# Patient Record
Sex: Female | Born: 1985
Health system: Southern US, Community
[De-identification: ages and names within clinical notes are randomized; demographics above are authoritative.]

## PROBLEM LIST (undated history)

## (undated) DIAGNOSIS — G43909 Migraine, unspecified, not intractable, without status migrainosus: Secondary | ICD-10-CM

## (undated) DIAGNOSIS — J45909 Unspecified asthma, uncomplicated: Secondary | ICD-10-CM

## (undated) HISTORY — PX: BACK SURGERY: SHX140

---

## 2017-03-21 ENCOUNTER — Emergency Department (HOSPITAL_COMMUNITY)
Admission: EM | Admit: 2017-03-21 | Discharge: 2017-03-21 | Disposition: A | Discharge status: Home / Self Care | Payer: BLUE CROSS/BLUE SHIELD | Attending: Emergency Medicine | Admitting: Emergency Medicine

## 2017-03-21 ENCOUNTER — Encounter (HOSPITAL_COMMUNITY): Payer: Self-pay | Admitting: Emergency Medicine

## 2017-03-21 DIAGNOSIS — J45909 Unspecified asthma, uncomplicated: Secondary | ICD-10-CM | POA: Diagnosis present

## 2017-03-21 DIAGNOSIS — J4541 Moderate persistent asthma with (acute) exacerbation: Secondary | ICD-10-CM | POA: Diagnosis not present

## 2017-03-21 HISTORY — DX: Unspecified asthma, uncomplicated: J45.909

## 2017-03-21 HISTORY — DX: Migraine, unspecified, not intractable, without status migrainosus: G43.909

## 2017-03-21 MED ORDER — SODIUM CHLORIDE 0.9 % IV BOLUS (SEPSIS)
500.0000 mL | Freq: Once | INTRAVENOUS | Status: AC
  Administered 2017-03-21: 500 mL via INTRAVENOUS

## 2017-03-21 MED ORDER — ALBUTEROL SULFATE HFA 108 (90 BASE) MCG/ACT IN AERS: 2.0000 | INHALATION_SPRAY | RESPIRATORY_TRACT | 2 refills | Status: DC | PRN

## 2017-03-21 MED ORDER — PREDNISONE 10 MG PO TABS
50.0000 mg | ORAL_TABLET | Freq: Every day | ORAL | 0 refills | Status: DC
Start: 2017-03-21 — End: 2017-08-04

## 2017-03-21 NOTE — ED Notes (Signed)
Bed: [id number] Expected date:  Expected time:  Means of arrival:  Comments: EMS/Asthma/Epi

## 2017-03-21 NOTE — ED Notes (Signed)
ED Provider at bedside. 

## 2017-03-21 NOTE — ED Provider Notes (Signed)
[facility]-EMERGENCY DEPT Provider Note   CSN: [id number] Arrival date & time: 03/21/17  1529     History   Chief Complaint Chief Complaint  Patient presents with  . Asthma    HPI Jasmin Hayes is a 31 y.o. female.  The history is provided by the patient.  Shortness of Breath  This is a recurrent problem. The average episode lasts 1 hour. The problem occurs intermittently.The current episode started 1 to 2 hours ago. The problem has been resolved. Associated symptoms include rhinorrhea and wheezing. Pertinent negatives include no fever, no coryza, no sore throat, no cough, no sputum production, no hemoptysis, no PND, no orthopnea, no chest pain, no syncope, no vomiting, no abdominal pain, no leg pain and no leg swelling. The problem's precipitants include weather/humidity, smoke and pollens. Risk factors: no recent immobilization, OCPs, recent surgery, long distance traveling. She has tried beta-agonist inhalers for the symptoms. The treatment provided no relief. She has had prior hospitalizations. She has had prior ED visits. She has had prior ICU admissions. Associated medical issues include asthma. Associated medical issues do not include PE or DVT.   31 year old female with history of moderate persistant asthma who presents with dyspnea. Onset 1-2 hours ago while walking her dog. Feels like her typical asthma exacerbation, and tried inhaler at home with no good affect. Called EMS and given IM epi, solumedrol, magnesium sulfate, 15 mg albuterol and 1 mg atrovent. On arrival to ED feels back to baseline.   Past Medical History:  Diagnosis Date  . Asthma   . Migraines     There are no active problems to display for this patient.   Past Surgical History:  Procedure Laterality Date  . BACK SURGERY      OB History    No data available       Home Medications    Prior to Admission medications   Medication Sig Start Date End Date Taking? Authorizing Provider  cetirizine  (ZYRTEC) 10 MG tablet Take 10 mg by mouth daily.   Yes Historical Provider, MD  cyclobenzaprine (FLEXERIL) 5 MG tablet Take 5 mg by mouth 3 (three) times daily as needed for muscle spasms.   Yes Historical Provider, MD  docusate sodium (COLACE) 100 MG capsule Take 100 mg by mouth daily.   Yes Historical Provider, MD  escitalopram (LEXAPRO) 10 MG tablet Take 10 mg by mouth at bedtime.   Yes Historical Provider, MD  fluticasone (FLONASE) 50 MCG/ACT nasal spray Place 2 sprays into both nostrils daily.   Yes Historical Provider, MD  Fluticasone-Salmeterol (ADVAIR) 250-50 MCG/DOSE AEPB Inhale 1 puff into the lungs 2 (two) times daily.   Yes Historical Provider, MD  Magnesium 250 MG TABS Take 1 tablet by mouth daily.   Yes Historical Provider, MD  omeprazole (PRILOSEC) 20 MG capsule Take 20 mg by mouth daily.   Yes Historical Provider, MD  topiramate (TOPAMAX) 100 MG tablet TK 1 T PO BID 02/17/17  Yes Historical Provider, MD  albuterol (PROVENTIL HFA;VENTOLIN HFA) 108 (90 Base) MCG/ACT inhaler Inhale 2 puffs into the lungs every 4 (four) hours as needed for wheezing or shortness of breath. 03/21/17   Lavera Guiseana Duo Benicio Manna, MD  predniSONE (DELTASONE) 10 MG tablet Take 5 tablets (50 mg total) by mouth daily. 03/21/17   Lavera Guiseana Duo Charlese Gruetzmacher, MD    Family History No family history on file.  Social History Social History  Substance Use Topics  . Smoking status: Not on file  . Smokeless tobacco:  Not on file  . Alcohol use Not on file     Allergies   Penicillins   Review of Systems Review of Systems  Constitutional: Negative for fever.  HENT: Positive for rhinorrhea. Negative for sore throat.   Respiratory: Positive for shortness of breath and wheezing. Negative for cough, hemoptysis and sputum production.   Cardiovascular: Negative for chest pain, orthopnea, leg swelling, syncope and PND.  Gastrointestinal: Negative for abdominal pain and vomiting.  Genitourinary: Negative for difficulty urinating.    Allergic/Immunologic: Negative for immunocompromised state.  Neurological: Negative for syncope.  Hematological: Does not bruise/bleed easily.  Psychiatric/Behavioral: Negative for confusion.  All other systems reviewed and are negative.    Physical Exam Updated Vital Signs BP (!) 109/57   Pulse 87   Temp 98.9 F (37.2 C) (Oral)   Resp 15   SpO2 98%   Physical Exam Physical Exam  Nursing note and vitals reviewed. Constitutional: Well developed, well nourished, non-toxic, and in no acute distress Head: Normocephalic and atraumatic.  Mouth/Throat: Oropharynx is clear and moist.  Neck: Normal range of motion. Neck supple.  Cardiovascular: Tachcyardic rate and regular rhythm.   Pulmonary/Chest: Effort normal and breath sounds normal.  Abdominal: Soft. There is no tenderness. There is no rebound and no guarding.  Musculoskeletal: Normal range of motion. no calf tenderness. No LE edema Neurological: Alert, no facial droop, fluent speech, moves all extremities symmetrically Skin: Skin is warm and dry.  Psychiatric: Cooperative   ED Treatments / Results  Labs (all labs ordered are listed, but only abnormal results are displayed) Labs Reviewed  PREGNANCY, URINE    EKG  EKG Interpretation None       Radiology No results found.  Procedures Procedures (including critical care time)  Medications Ordered in ED Medications  sodium chloride 0.9 % bolus 500 mL (0 mLs Intravenous Stopped 03/21/17 1840)     Initial Impression / Assessment and Plan / ED Course  I have reviewed the triage vital signs and the nursing notes.  Pertinent labs & imaging results that were available during my care of the patient were reviewed by me and considered in my medical decision making (see chart for details).     Presenting with typical asthma exacerbation, which is resolved now after extensive treatment by EMS. Feels back to baseline. States environmental triggers have been her biggest  trigger to asthma exacerbations recently and she sees a pulmonologist from [place].   She is observed in the ED for 3 hours, and continues to feel well with normal vital signs and normal work of breathing. Lungs remain clear. She is able to continue treatments at home based every 3-4 hours as needed. We'll also discharge home with steroids.  Strict return and follow-up instructions reviewed. She expressed understanding of all discharge instructions and felt comfortable with the plan of care.   Final Clinical Impressions(s) / ED Diagnoses   Final diagnoses:  Moderate persistent asthma with acute exacerbation    New Prescriptions New Prescriptions   ALBUTEROL (PROVENTIL HFA;VENTOLIN HFA) 108 (90 BASE) MCG/ACT INHALER    Inhale 2 puffs into the lungs every 4 (four) hours as needed for wheezing or shortness of breath.   PREDNISONE (DELTASONE) 10 MG TABLET    Take 5 tablets (50 mg total) by mouth daily.     Lavera Guise, MD 03/21/17 [phone]

## 2017-03-21 NOTE — ED Triage Notes (Signed)
Per EMS, patient from home, c/o asthma attack after walking dog today. Audible wheezing upon arrival. Breathing treatment (15 mg Albuterol, 1mg  Atrovent), 0.3 epi, 125 mg Solumedrol, 2g Mag Sulfate with EMS. 20 g L Hand.

## 2017-03-21 NOTE — Discharge Instructions (Signed)
Please take steroids as prescribed. He can continue to use your inhaler or nebulizer every 3-4 hours for shortness of breath. Return without fail for worsening symptoms, including difficulty breathing, passing out, confusion, or any other symptoms concerning to you

## 2017-03-26 ENCOUNTER — Encounter: Payer: Self-pay | Admitting: Neurology

## 2017-03-26 ENCOUNTER — Ambulatory Visit (INDEPENDENT_AMBULATORY_CARE_PROVIDER_SITE_OTHER): Payer: BLUE CROSS/BLUE SHIELD | Admitting: Neurology

## 2017-03-26 VITALS — BP 106/62 | HR 76 | Ht 67.0 in | Wt 203.0 lb

## 2017-03-26 DIAGNOSIS — G43809 Other migraine, not intractable, without status migrainosus: Secondary | ICD-10-CM

## 2017-03-26 MED ORDER — ZOLMITRIPTAN 5 MG NA SOLN: 1.0000 | NASAL | 0 refills | Status: DC | PRN

## 2017-03-26 MED ORDER — TOPIRAMATE 100 MG PO TABS: 250.0000 mg | ORAL_TABLET | Freq: Every day | ORAL | 3 refills | Status: DC

## 2017-03-26 MED ORDER — ZOLMITRIPTAN 5 MG NA SOLN: NASAL | 3 refills | Status: DC

## 2017-03-26 NOTE — Progress Notes (Signed)
NEUROLOGY CONSULTATION NOTE  Jasmin Hayes MRN: [id number] DOB: 08/19/1986  Referring provider: Dr. Wynelle Link Primary care provider: Dr. Wynelle Link  Reason for consult:  migraines  HISTORY OF PRESENT ILLNESS: Jasmin Hayes is a 31 year old female who presents for migraines.  She is accompanied by her fiancee who supplements history.  Onset:  Since mid 20s following back surgery.  They were well controlled when she was living in [place], but have increased since moving down to [place].   Location:  Bilateral/unilateral occipital radiating up Quality:  stabbing Intensity:  10/10 Aura:  Flashing lights and scotomas.  Once it was followed by vision loss Prodrome:  no Postdrome:  no Associated symptoms:  Nausea, vomiting, photophobia, phonophobia.  She has not had any new worse headache of her life, waking up from sleep Duration:  10-15 minutes with Cambia.  Otherwise, all day Also has daily pressure headache and in face and around eyes. Frequency:  15 headache days per month (1 to 2 days migraines) Frequency of abortive medication: almost daily Triggers/exacerbating factors:  Multiple triggers since moving down here:  Change in weather with increased allergies, now taking lower dose of topiramate (  instead of  daily), irregular menses (PCOS), stress. Relieving factors:  Cambia (but very expensive) Activity:  aggravates  Past NSAIDS:  Cambia (effective but expensive), ibuprofen Past analgesics:  Tylenol, Goodys Past abortive triptans:  Sumatriptan tablet Past muscle relaxants:  no Past anti-emetic:  no Past antihypertensive medications:  no Past antidepressant medications:  Nortriptyline, amitriptyline Past anticonvulsant medications:  no Past vitamins/Herbal/Supplements:  no Other past therapies:  Trigger point injections in neck and shoulders  Current NSAIDS:  naproxen Current analgesics:  Excedrin Migraine Current triptans:  no Current anti-emetic:  no Current  muscle relaxants:  Flexeril (for back) Current anti-anxiolytic:  no Current sleep aide:  no Current Antihypertensive medications:  no Current Antidepressant medications:  Lexapro Current Anticonvulsant medications:  topiramate  Current Vitamins/Herbal/Supplements:  Mg  Current Antihistamines/Decongestants:  Zyrtec Other therapy:  no  Caffeine:  no Alcohol:  no Smoker:  no Diet:  Drinks water Exercise:  walks Depression/anxiety:  anxiety Sleep hygiene:  varies Family history of headache:  mom  PAST MEDICAL HISTORY: Past Medical History:  Diagnosis Date  . Asthma   . Migraines     PAST SURGICAL HISTORY: Past Surgical History:  Procedure Laterality Date  . BACK SURGERY      MEDICATIONS: Current Outpatient Prescriptions on File Prior to Visit  Medication Sig Dispense Refill  . albuterol (PROVENTIL HFA;VENTOLIN HFA) 108 (90 Base) MCG/ACT inhaler Inhale 2 puffs into the lungs every 4 (four) hours as needed for wheezing or shortness of breath. 1 Inhaler 2  . cetirizine (ZYRTEC) 10 MG tablet Take 10 mg by mouth daily.    . cyclobenzaprine (FLEXERIL) 5 MG tablet Take 5 mg by mouth 3 (three) times daily as needed for muscle spasms.    Marland Kitchen docusate sodium (COLACE) 100 MG capsule Take 100 mg by mouth daily.    Marland Kitchen escitalopram (LEXAPRO) 10 MG tablet Take 10 mg by mouth at bedtime.    . fluticasone (FLONASE) 50 MCG/ACT nasal spray Place 2 sprays into both nostrils daily.    . Fluticasone-Salmeterol (ADVAIR) 250-50 MCG/DOSE AEPB Inhale 1 puff into the lungs 2 (two) times daily.    . Magnesium 250 MG TABS Take 1 tablet by mouth daily.    Marland Kitchen omeprazole (PRILOSEC) 20 MG capsule Take 20 mg by mouth daily.    . predniSONE (DELTASONE)  10 MG tablet Take 5 tablets (50 mg total) by mouth daily. 20 tablet 0   No current facility-administered medications on file prior to visit.     ALLERGIES: Allergies  Allergen Reactions  . Penicillins     Has patient had a PCN reaction causing  immediate rash, facial/tongue/throat swelling, SOB or lightheadedness with hypotension: Yes Has patient had a PCN reaction causing severe rash involving mucus membranes or skin necrosis: No Has patient had a PCN reaction that required hospitalization Yes Has patient had a PCN reaction occurring within the last 10 years: No If all of the above answers are "NO", then may proceed with Cephalosporin use.     FAMILY HISTORY: No family history on file.  SOCIAL HISTORY: Social History   Social History  . Marital status: Significant Other    Spouse name: N/A  . Number of children: N/A  . Years of education: N/A   Occupational History  . Not on file.   Social History Main Topics  . Smoking status: Not on file  . Smokeless tobacco: Not on file  . Alcohol use Not on file  . Drug use: Unknown  . Sexual activity: Not on file   Other Topics Concern  . Not on file   Social History Narrative  . No narrative on file    REVIEW OF SYSTEMS: Constitutional: No fevers, chills, or sweats, no generalized fatigue, change in appetite Eyes: No visual changes, double vision, eye pain Ear, nose and throat: No hearing loss, ear pain, nasal congestion, sore throat Cardiovascular: No chest pain, palpitations Respiratory:  No shortness of breath at rest or with exertion, wheezes GastrointestinaI: No nausea, vomiting, diarrhea, abdominal pain, fecal incontinence Genitourinary:  No dysuria, urinary retention or frequency Musculoskeletal:  No neck pain, back pain Integumentary: No rash, pruritus, skin lesions Neurological: as above Psychiatric: No depression, insomnia, anxiety Endocrine: No palpitations, fatigue, diaphoresis, mood swings, change in appetite, change in weight, increased thirst Hematologic/Lymphatic:  No purpura, petechiae. Allergic/Immunologic: no itchy/runny eyes, nasal congestion, recent allergic reactions, rashes  PHYSICAL EXAM: Vitals:   03/26/17 1456  BP: 106/62  Pulse: 76     General: No acute distress.  Patient appears well-groomed.  Head:  Normocephalic/atraumatic Eyes:  fundi examined but not visualized Neck: supple, no paraspinal tenderness, full range of motion Back: No paraspinal tenderness Heart: regular rate and rhythm Lungs: Clear to auscultation bilaterally. Vascular: No carotid bruits. Neurological Exam: Mental status: alert and oriented to person, place, and time, recent and remote memory intact, fund of knowledge intact, attention and concentration intact, speech fluent and not dysarthric, language intact. Cranial nerves: CN I: not tested CN II: pupils equal, round and reactive to light, visual fields intact CN III, IV, VI:  full range of motion, no nystagmus, no ptosis CN V: facial sensation intact CN VII: upper and lower face symmetric CN VIII: hearing intact CN IX, X: gag intact, uvula midline CN XI: sternocleidomastoid and trapezius muscles intact CN XII: tongue midline Bulk & Tone: normal, no fasciculations. Motor:  5/5 throughout  Sensation: temperature and vibration sensation intact. Deep Tendon Reflexes:  2+ throughout, toes downgoing.  Finger to nose testing:  Without dysmetria.  Heel to shin:  Without dysmetria.  Gait:  Normal station and stride.  Able to turn and tandem walk. Romberg negative.  IMPRESSION: Cervicogenic migraines, complicated by medication overuse and seasonal allergies.  PLAN: 1.  Increase topiramate to  at bedtime.  Call in 4 weeks with update and we can adjust dose  if needed.  Take folic acid  daily.  Advised not to get pregnant while on this medication. 2.  Take Zomig , 1 spray in one nostril at earliest onset of headache.  May repeat dose once in 2 hours if needed.  Do not exceed two sprays in 24 hours. 3.  Stop naproxen and Tylenol.  Limit use of pain relievers to no more than 2 days out of the week.  These medications include acetaminophen, ibuprofen, triptans and narcotics.  This will help  reduce risk of rebound headaches. 4.  Be aware of common food triggers such as processed sweets, processed foods with nitrites (such as deli meat, hot dogs, sausages), foods with MSG, alcohol (such as wine), chocolate, certain cheeses, certain fruits (dried fruits, some citrus fruit), vinegar, diet soda. 4.  Avoid caffeine 5.  Routine exercise 6.  Proper sleep hygiene 7.  Stay adequately hydrated with water 8.  Keep a headache diary. 9.  Maintain proper stress management. 10.  Do not skip meals. 11.  Consider supplements:  Magnesium citrate  to  daily, riboflavin , Coenzyme Q 10  three times daily 12.  Refer to Dr. Antoine Primas of Sports Medicine for neck pain 13.  Follow up in 3 months.  Thank you for allowing me to take part in the care of this patient.  Shon Millet, DO  CC:  Deatra James, MD

## 2017-03-26 NOTE — Patient Instructions (Addendum)
Migraine Recommendations: 1.  Increase topiramate to  at bedtime.  Call in 4 weeks with update and we can adjust dose if needed. 2.  Take Zomig , 1 spray in one nostril at earliest onset of headache.  May repeat dose once in 2 hours if needed.  Do not exceed two sprays in 24 hours. 3.  Stop naproxen and Tylenol.  Limit use of pain relievers to no more than 2 days out of the week.  These medications include acetaminophen, ibuprofen, triptans and narcotics.  This will help reduce risk of rebound headaches. 4.  Be aware of common food triggers such as processed sweets, processed foods with nitrites (such as deli meat, hot dogs, sausages), foods with MSG, alcohol (such as wine), chocolate, certain cheeses, certain fruits (dried fruits, some citrus fruit), vinegar, diet soda. 4.  Avoid caffeine 5.  Routine exercise 6.  Proper sleep hygiene 7.  Stay adequately hydrated with water 8.  Keep a headache diary. 9.  Maintain proper stress management. 10.  Do not skip meals. 11.  Consider supplements:  Magnesium citrate  to  daily, riboflavin , Coenzyme Q 10  three times daily 12.  Refer to Dr. Antoine Primas of Sports Medicine for neck pain 13.  Follow up in 3 months.

## 2017-05-24 ENCOUNTER — Telehealth: Payer: Self-pay | Admitting: Neurology

## 2017-05-24 MED ORDER — TOPIRAMATE 100 MG PO TABS: 250.0000 mg | ORAL_TABLET | Freq: Every day | ORAL | 2 refills | Status: DC

## 2017-05-24 NOTE — Telephone Encounter (Signed)
Prescription for topiramate  [facility] [address] [facility] of [facility]

## 2017-05-24 NOTE — Telephone Encounter (Signed)
RX sent

## 2017-06-25 ENCOUNTER — Ambulatory Visit: Payer: BLUE CROSS/BLUE SHIELD | Admitting: Neurology

## 2017-06-25 DIAGNOSIS — Z029 Encounter for administrative examinations, unspecified: Secondary | ICD-10-CM

## 2017-07-10 ENCOUNTER — Encounter: Payer: Self-pay | Admitting: Neurology

## 2017-08-04 ENCOUNTER — Emergency Department (HOSPITAL_COMMUNITY): Payer: Self-pay

## 2017-08-04 ENCOUNTER — Encounter (HOSPITAL_COMMUNITY): Payer: Self-pay | Admitting: *Deleted

## 2017-08-04 ENCOUNTER — Emergency Department (HOSPITAL_COMMUNITY)
Admission: EM | Admit: 2017-08-04 | Discharge: 2017-08-04 | Disposition: A | Discharge status: Home / Self Care | Payer: Self-pay | Attending: Emergency Medicine | Admitting: Emergency Medicine

## 2017-08-04 DIAGNOSIS — Y939 Activity, unspecified: Secondary | ICD-10-CM | POA: Insufficient documentation

## 2017-08-04 DIAGNOSIS — S060X1A Concussion with loss of consciousness of 30 minutes or less, initial encounter: Secondary | ICD-10-CM | POA: Insufficient documentation

## 2017-08-04 DIAGNOSIS — W1830XA Fall on same level, unspecified, initial encounter: Secondary | ICD-10-CM | POA: Insufficient documentation

## 2017-08-04 DIAGNOSIS — M5430 Sciatica, unspecified side: Secondary | ICD-10-CM

## 2017-08-04 DIAGNOSIS — Y999 Unspecified external cause status: Secondary | ICD-10-CM | POA: Insufficient documentation

## 2017-08-04 DIAGNOSIS — J45909 Unspecified asthma, uncomplicated: Secondary | ICD-10-CM | POA: Insufficient documentation

## 2017-08-04 DIAGNOSIS — Y92000 Kitchen of unspecified non-institutional (private) residence as  the place of occurrence of the external cause: Secondary | ICD-10-CM | POA: Insufficient documentation

## 2017-08-04 DIAGNOSIS — Z79899 Other long term (current) drug therapy: Secondary | ICD-10-CM | POA: Insufficient documentation

## 2017-08-04 DIAGNOSIS — R55 Syncope and collapse: Secondary | ICD-10-CM | POA: Insufficient documentation

## 2017-08-04 DIAGNOSIS — M5431 Sciatica, right side: Secondary | ICD-10-CM | POA: Insufficient documentation

## 2017-08-04 LAB — BASIC METABOLIC PANEL
ANION GAP: 8 (ref 5–15)
BUN: 10 mg/dL (ref 6–20)
CHLORIDE: 108 mmol/L (ref 101–111)
CO2: 24 mmol/L (ref 22–32)
Calcium: 9.4 mg/dL (ref 8.9–10.3)
Creatinine, Ser: 0.61 mg/dL (ref 0.44–1.00)
GFR calc Af Amer: >60 mL/min (ref >60)
GFR calc non Af Amer: >60 mL/min (ref >60)
GLUCOSE: 92 mg/dL (ref 65–99)
POTASSIUM: 3.4 mmol/L — AB (ref 3.5–5.1)
Sodium: 140 mmol/L (ref 135–145)

## 2017-08-04 LAB — CBC WITH DIFFERENTIAL/PLATELET
Basophils Absolute: 0 10*3/uL (ref 0.0–0.1)
Basophils Relative: 0 %
Eosinophils Absolute: 0.1 10*3/uL (ref 0.0–0.7)
Eosinophils Relative: 1 %
HEMATOCRIT: 38.9 % (ref 36.0–46.0)
Hemoglobin: 13.3 g/dL (ref 12.0–15.0)
LYMPHS ABS: 3.1 10*3/uL (ref 0.7–4.0)
LYMPHS PCT: 34 %
MCH: 30.3 pg (ref 26.0–34.0)
MCHC: 34.2 g/dL (ref 30.0–36.0)
MCV: 88.6 fL (ref 78.0–100.0)
MONO ABS: 0.5 10*3/uL (ref 0.1–1.0)
MONOS PCT: 5 %
NEUTROS ABS: 5.5 10*3/uL (ref 1.7–7.7)
Neutrophils Relative %: 60 %
Platelets: 277 10*3/uL (ref 150–400)
RBC: 4.39 MIL/uL (ref 3.87–5.11)
RDW: 12.5 % (ref 11.5–15.5)
WBC: 9.3 10*3/uL (ref 4.0–10.5)

## 2017-08-04 LAB — I-STAT BETA HCG BLOOD, ED (MC, WL, AP ONLY): I-stat hCG, quantitative: <5 m[IU]/mL (ref <5)

## 2017-08-04 LAB — CBG MONITORING, ED: GLUCOSE-CAPILLARY: 89 mg/dL (ref 65–99)

## 2017-08-04 LAB — TROPONIN I: Troponin I: <0.03 ng/mL (ref <0.03)

## 2017-08-04 MED ORDER — LORAZEPAM 2 MG/ML IJ SOLN
1.0000 mg | Freq: Once | INTRAMUSCULAR | Status: AC
  Administered 2017-08-04: 1 mg via INTRAVENOUS
  Filled 2017-08-04: qty 1

## 2017-08-04 MED ORDER — DEXAMETHASONE SODIUM PHOSPHATE 10 MG/ML IJ SOLN
10.0000 mg | Freq: Once | INTRAMUSCULAR | Status: AC
  Administered 2017-08-04: 10 mg via INTRAVENOUS
  Filled 2017-08-04: qty 1

## 2017-08-04 MED ORDER — ONDANSETRON HCL 4 MG/2ML IJ SOLN
4.0000 mg | Freq: Once | INTRAMUSCULAR | Status: AC
  Administered 2017-08-04: 4 mg via INTRAVENOUS
  Filled 2017-08-04: qty 2

## 2017-08-04 MED ORDER — KETOROLAC TROMETHAMINE 10 MG PO TABS: 10.0000 mg | ORAL_TABLET | Freq: Four times a day (QID) | ORAL | 0 refills | Status: AC | PRN

## 2017-08-04 MED ORDER — SODIUM CHLORIDE 0.9 % IV SOLN
INTRAVENOUS | Status: DC
  Administered 2017-08-04: 20:00:00 via INTRAVENOUS

## 2017-08-04 MED ORDER — PREDNISONE 10 MG (21) PO TBPK: ORAL_TABLET | ORAL | 0 refills | Status: DC

## 2017-08-04 MED ORDER — SODIUM CHLORIDE 0.9 % IV BOLUS (SEPSIS)
1000.0000 mL | Freq: Once | INTRAVENOUS | Status: AC
  Administered 2017-08-04: 1000 mL via INTRAVENOUS

## 2017-08-04 MED ORDER — MORPHINE SULFATE (PF) 2 MG/ML IV SOLN
4.0000 mg | Freq: Once | INTRAVENOUS | Status: AC
  Administered 2017-08-04: 4 mg via INTRAVENOUS
  Filled 2017-08-04: qty 2

## 2017-08-04 MED ORDER — HYDROMORPHONE HCL 1 MG/ML IJ SOLN
1.0000 mg | Freq: Once | INTRAMUSCULAR | Status: AC
  Administered 2017-08-04: 1 mg via INTRAVENOUS
  Filled 2017-08-04: qty 1

## 2017-08-04 NOTE — ED Triage Notes (Signed)
EMS reports pts sciatica flare in her rt hip caused her to fall and hit head on ? [employer]. Pt has history of sciatica. LOC noted, pt anxious in beginning then calm. A&O x 4 now. #18 L f/a

## 2017-08-04 NOTE — ED Provider Notes (Signed)
[facility]-EMERGENCY DEPT Provider Note   CSN: [id number] Arrival date & time: 08/04/17  [id number]     History   Chief Complaint Chief Complaint  Patient presents with  . Fall  . Hit Head    HPI Jasmin Hayes is a 31 y.o. female.  Pt presents to the ED today with a syncope episode.  She thinks her right leg gave out and she hit her head on the dishwasher, but she is not sure.  Her right sided sciatica has been bothering her for the past few days.  She does have an appt with her pcp for that in the next few days.  Her right leg did go out earlier today.  She said that she last remembered going to the kitchen and woke up on the floor.  She c/o headache.      Past Medical History:  Diagnosis Date  . Asthma   . Migraines     There are no active problems to display for this patient.   Past Surgical History:  Procedure Laterality Date  . BACK SURGERY      OB History    No data available       Home Medications    Prior to Admission medications   Medication Sig Start Date End Date Taking? Authorizing Provider  albuterol (PROVENTIL HFA;VENTOLIN HFA) 108 (90 Base) MCG/ACT inhaler Inhale 2 puffs into the lungs every 4 (four) hours as needed for wheezing or shortness of breath. 03/21/17   Lavera GuiseLiu, Dana Duo, MD  cetirizine (ZYRTEC) 10 MG tablet Take 10 mg by mouth daily.    [provider]  cyclobenzaprine (FLEXERIL) 5 MG tablet Take 5 mg by mouth 3 (three) times daily as needed for muscle spasms.    [provider]  docusate sodium (COLACE) 100 MG capsule Take 100 mg by mouth daily.    [provider]  escitalopram (LEXAPRO) 10 MG tablet Take 10 mg by mouth at bedtime.    [provider]  fluticasone (FLONASE) 50 MCG/ACT nasal spray Place 2 sprays into both nostrils daily.    [provider]  Fluticasone-Salmeterol (ADVAIR) 250-50 MCG/DOSE AEPB Inhale 1 puff into the lungs 2 (two) times daily.    [provider]  ketorolac (TORADOL)  10 MG tablet Take 1 tablet (10 mg total) by mouth every 6 (six) hours as needed. 08/04/17   Jacalyn LefevreHaviland, Tris Howell, MD  Magnesium 250 MG TABS Take 1 tablet by mouth daily.    [provider]  omeprazole (PRILOSEC) 20 MG capsule Take 20 mg by mouth daily.    [provider]  predniSONE (STERAPRED UNI-PAK 21 TAB) 10 MG (21) TBPK tablet Take 6 tabs by mouth daily  for 2 days, then 5 tabs for 2 days, then 4 tabs for 2 days, then 3 tabs for 2 days, 2 tabs for 2 days, then 1 tab by mouth daily for 2 days 08/04/17   Jacalyn LefevreHaviland, Garnette Greb, MD  topiramate (TOPAMAX) 100 MG tablet Take 2.5 tablets (250 mg total) by mouth at bedtime. 05/24/17   Drema DallasJaffe, Adam R, DO  zolmitriptan (ZOMIG) 5 MG nasal solution 1 spray in nose at earliest onset of migraine.  May repeat once in 2 hours if needed. 03/26/17   Drema DallasJaffe, Adam R, DO  zolmitriptan (ZOMIG) 5 MG nasal solution Place 1 spray into the nose as needed for migraine. 03/26/17   Drema DallasJaffe, Adam R, DO    Family History No family history on file.  Social History Social History  Substance  Use Topics  . Smoking status: Never Smoker  . Smokeless tobacco: Never Used  . Alcohol use Not on file     Allergies   Penicillins   Review of Systems Review of Systems  Neurological: Positive for syncope and headaches.  All other systems reviewed and are negative.    Physical Exam Updated Vital Signs BP (!) 115/99 (BP Location: Right Arm)   Pulse 79   Temp (!) 97.2 F (36.2 C) (Oral)   Resp 17   LMP 07/05/2017   SpO2 95%   Physical Exam  Constitutional: She is oriented to person, place, and time. She appears well-developed. She appears distressed.  HENT:  Head: Normocephalic and atraumatic.  Right Ear: External ear normal.  Left Ear: External ear normal.  Nose: Nose normal.  Mouth/Throat: Oropharynx is clear and moist.  Eyes: Pupils are equal, round, and reactive to light. Conjunctivae and EOM are normal.  Neck: Normal range of motion. Neck supple.    Cardiovascular: Normal rate, regular rhythm, normal heart sounds and intact distal pulses.   Pulmonary/Chest: Effort normal and breath sounds normal.  Abdominal: Soft. Bowel sounds are normal.  Musculoskeletal: Normal range of motion.  Neurological: She is alert and oriented to person, place, and time.  + straight leg raise  Skin: Skin is warm.  Psychiatric: She has a normal mood and affect. Her behavior is normal. Judgment and thought content normal.  Nursing note and vitals reviewed.    ED Treatments / Results  Labs (all labs ordered are listed, but only abnormal results are displayed) Labs Reviewed  BASIC METABOLIC PANEL - Abnormal; Notable for the following:       Result Value   Potassium 3.4 (*)    All other components within normal limits  CBC WITH DIFFERENTIAL/PLATELET  TROPONIN I  URINALYSIS, ROUTINE W REFLEX MICROSCOPIC  CBG MONITORING, ED  I-STAT BETA HCG BLOOD, ED ([facility], [facility], [facility] ONLY)  POC URINE PREG, ED    EKG  EKG Interpretation  Date/Time:  Saturday August 04 2017 19:37:55 EDT Ventricular Rate:  67 PR Interval:    QRS Duration: 90 QT Interval:  385 QTC Calculation: 407 R Axis:   13 Text Interpretation:  Sinus rhythm Confirmed by Jacalyn Lefevre [phone]) on 08/04/2017 8:11:14 PM       Radiology Dg Chest 2 View  Result Date: 08/04/2017 CLINICAL DATA:  Fall EXAM: CHEST  2 VIEW COMPARISON:  None. FINDINGS: Normal heart size. Normal mediastinal contour. No pneumothorax. No pleural effusion. Lungs appear clear, with no acute consolidative airspace disease and no pulmonary edema. IMPRESSION: No active cardiopulmonary disease. Electronically Signed   By: Delbert Phenix M.D.   On: 08/04/2017 20:52   Dg Lumbar Spine Complete  Result Date: 08/04/2017 CLINICAL DATA:  31 year old female with history of trauma from a fall at home complaining of back pain. EXAM: LUMBAR SPINE - COMPLETE 4+ VIEW COMPARISON:  No priors. FINDINGS: Five views of the lumbar spine demonstrate  postoperative changes of PLIF at L4-S1, with interbody grafts at L4-L5 and L5-S1. Alignment is anatomic. No evidence of acute hardware abnormality. No acute displaced fractures or compression type fractures in the lumbar spine. IMPRESSION: 1. No acute radiographic abnormality of the lumbar spine. Postoperative changes, as above. Electronically Signed   By: Trudie Reed M.D.   On: 08/04/2017 20:58   Ct Head Wo Contrast  Result Date: 08/04/2017 CLINICAL DATA:  31 year old female with history of trauma from a fall with injury to the head. Loss of consciousness. EXAM:  CT HEAD WITHOUT CONTRAST TECHNIQUE: Contiguous axial images were obtained from the base of the skull through the vertex without intravenous contrast. COMPARISON:  None. FINDINGS: Brain: No evidence of acute infarction, hemorrhage, hydrocephalus, extra-axial collection or mass lesion/mass effect. Vascular: No hyperdense vessel or unexpected calcification. Skull: Normal. Negative for fracture or focal lesion. Sinuses/Orbits: No acute finding. Other: None. IMPRESSION: 1. No evidence of significant acute traumatic injury to the skull or brain. 2. The appearance of the brain is normal. Electronically Signed   By: Trudie Reed M.D.   On: 08/04/2017 21:14    Procedures Procedures (including critical care time)  Medications Ordered in ED Medications  sodium chloride 0.9 % bolus 1,000 mL (0 mLs Intravenous Stopped 08/04/17 2059)    And  0.9 %  sodium chloride infusion ( Intravenous New Bag/Given 08/04/17 1954)  LORazepam (ATIVAN) injection 1 mg (not administered)  morphine 2 MG/ML injection 4 mg (4 mg Intravenous Given 08/04/17 1949)  ondansetron (ZOFRAN) injection 4 mg (4 mg Intravenous Given 08/04/17 1950)  dexamethasone (DECADRON) injection 10 mg (10 mg Intravenous Given 08/04/17 1949)  HYDROmorphone (DILAUDID) injection 1 mg (1 mg Intravenous Given 08/04/17 2059)     Initial Impression / Assessment and Plan / ED Course  I have reviewed  the triage vital signs and the nursing notes.  Pertinent labs & imaging results that were available during my care of the patient were reviewed by me and considered in my medical decision making (see chart for details).    Pt is feeling better.  She is encouraged to f/u with pcp and to return if worse.  Final Clinical Impressions(s) / ED Diagnoses   Final diagnoses:  Concussion with loss of consciousness of 30 minutes or less, initial encounter  Acute sciatica    New Prescriptions New Prescriptions   KETOROLAC (TORADOL) 10 MG TABLET    Take 1 tablet (10 mg total) by mouth every 6 (six) hours as needed.   PREDNISONE (STERAPRED UNI-PAK 21 TAB) 10 MG (21) TBPK TABLET    Take 6 tabs by mouth daily  for 2 days, then 5 tabs for 2 days, then 4 tabs for 2 days, then 3 tabs for 2 days, 2 tabs for 2 days, then 1 tab by mouth daily for 2 days     Jacalyn Lefevre, MD 08/04/17 2133

## 2017-09-14 ENCOUNTER — Encounter: Payer: Self-pay | Admitting: Family Medicine

## 2017-09-30 ENCOUNTER — Emergency Department (HOSPITAL_COMMUNITY): Payer: BLUE CROSS/BLUE SHIELD

## 2017-09-30 ENCOUNTER — Encounter (HOSPITAL_COMMUNITY): Payer: Self-pay | Admitting: Emergency Medicine

## 2017-09-30 ENCOUNTER — Emergency Department (HOSPITAL_COMMUNITY)
Admission: EM | Admit: 2017-09-30 | Discharge: 2017-09-30 | Disposition: A | Discharge status: Home / Self Care | Payer: BLUE CROSS/BLUE SHIELD | Attending: Emergency Medicine | Admitting: Emergency Medicine

## 2017-09-30 DIAGNOSIS — J45909 Unspecified asthma, uncomplicated: Secondary | ICD-10-CM | POA: Insufficient documentation

## 2017-09-30 DIAGNOSIS — Z79899 Other long term (current) drug therapy: Secondary | ICD-10-CM | POA: Insufficient documentation

## 2017-09-30 DIAGNOSIS — J069 Acute upper respiratory infection, unspecified: Secondary | ICD-10-CM

## 2017-09-30 DIAGNOSIS — N390 Urinary tract infection, site not specified: Secondary | ICD-10-CM | POA: Insufficient documentation

## 2017-09-30 MED ORDER — BENZONATATE 100 MG PO CAPS: 100.0000 mg | ORAL_CAPSULE | Freq: Three times a day (TID) | ORAL | 0 refills | Status: DC

## 2017-09-30 MED ORDER — IPRATROPIUM-ALBUTEROL 0.5-2.5 (3) MG/3ML IN SOLN
3.0000 mL | Freq: Once | RESPIRATORY_TRACT | Status: AC
  Administered 2017-09-30: 3 mL via RESPIRATORY_TRACT
  Filled 2017-09-30: qty 3

## 2017-09-30 MED ORDER — DEXAMETHASONE SODIUM PHOSPHATE 10 MG/ML IJ SOLN
10.0000 mg | Freq: Once | INTRAMUSCULAR | Status: AC
  Administered 2017-09-30: 10 mg via INTRAMUSCULAR
  Filled 2017-09-30: qty 1

## 2017-09-30 NOTE — ED Triage Notes (Addendum)
Pt reports productive cough, generalized body aches, and L side ear ache for the past 3 days. Hx of asthma and reports her symptoms are making her asthma exacerbate. No wheezing noted

## 2017-09-30 NOTE — Discharge Instructions (Signed)
There were no acute abnormalities on the chest x-ray. You demonstrated improvement with the DuoNebs here in the ED. Your symptoms are consistent with a viral illness. Viruses do not require antibiotics. Treatment is symptomatic care and it is important to note that these symptoms may last for 7-14 days.   Hand washing: Wash your hands throughout the day, but especially before and after touching the face, using the restroom, sneezing, coughing, or touching surfaces that have been coughed or sneezed upon. Hydration: Symptoms will be intensified and complicated by dehydration. Dehydration can also extend the duration of symptoms. Drink plenty of fluids and get plenty of rest. You should be drinking at least half a liter of water an hour to stay hydrated. Electrolyte drinks are also encouraged. You should be drinking enough fluids to make your urine light yellow, almost clear. If this is not the case, you are not drinking enough water. Please note that some of the treatments indicated below will not be effective if you are not adequately hydrated. Pain or fever: Ibuprofen, Naproxen, or Tylenol for pain or fever.  Cough: Use the Tessalon for cough.  Congestion: Plain Mucinex may help relieve congestion. Saline sinus rinses and saline nasal sprays may also help relieve congestion. If you do not have heart problems or an allergy to such medications, you may also try phenylephrine or Sudafed. Sore throat: Warm liquids or Chloraseptic spray may help soothe a sore throat. Gargle twice a day with a salt water solution made from a half teaspoon of salt in a cup of warm water.  Follow up: Follow up with a primary care provider, as needed, for any future management of this issue, however, you should follow up with a primary care provider at some point to establish care for treatment of your asthma. Please refer to the contacts included in this paperwork. Return: Return to the ED should any symptoms worsen.

## 2017-09-30 NOTE — ED Notes (Signed)
Patient transported to X-ray 

## 2017-09-30 NOTE — ED Notes (Signed)
Pt informed about current wait. 

## 2017-09-30 NOTE — Care Management Note (Signed)
Case Management Note  Patient Details  Name: Jasmin Hayes MRN: [id number] Date of Birth: 06-04-1986  Subjective/Objective:      Asthma exacerbation              Action/Plan: Discharge Planning: Attempted call to pt via phone left message on vm on phone for return call. Information provided on pt dc instructions to call [facility] or [facility] to schedule follow appt. Pt was working and had insurance. Recently lost coverage and has not been able to see her physicians.   PCP  Deatra James MD   Expected Discharge Date:                Expected Discharge Plan:  Home/Self Care  In-House Referral:  NA  Discharge planning Services  CM Consult, [facility]  Post Acute Care Choice:  NA Choice offered to:  NA  DME Arranged:  N/A DME Agency:  NA  HH Arranged:  NA HH Agency:  NA  Status of Service:  Completed, signed off  If discussed at Long Length of Stay Meetings, dates discussed:    Additional Comments:  Elliot Cousin, RN 09/30/2017, 5:59 PM

## 2017-09-30 NOTE — ED Provider Notes (Signed)
[facility]-EMERGENCY DEPT Provider Note   CSN: [id number] Arrival date & time: 09/30/17  1252     History   Chief Complaint Chief Complaint  Patient presents with  . Cough    HPI Jasmin Hayes is a 31 y.o. female.  HPI   Jasmin Hayes is a 31 y.o. female, with a history of asthma and migraines, presenting to the ED with upper respiratory congestion and productive cough with yellow/green sputum for the last 3 days. Also endorses some shortness of breath and subjective fever yesterday. She has had sick contacts with similar symptoms. Complains of intermittent left ear pressure.  Patient has been regularly using her home albuterol nebulizer about every 4 hours with some improvement. She has also been compliant with her daily Flonase, Advair, and Allegra-D. She has not had use her rescue inhaler. LMP 09/23/2017.  Patient states she has had previous severe asthma exacerbations resulting in BiPAP use and ICU admission, however, does not feel that today's symptoms are consistent with or close to one of these serious exacerbations.  Denies chest pain, N/V/D, abdominal pain, rash, sore throat, or any other complaints.     Past Medical History:  Diagnosis Date  . Asthma   . Migraines     There are no active problems to display for this patient.   Past Surgical History:  Procedure Laterality Date  . BACK SURGERY      OB History    No data available       Home Medications    Prior to Admission medications   Medication Sig Start Date End Date Taking? Authorizing Provider  albuterol (PROVENTIL HFA;VENTOLIN HFA) 108 (90 Base) MCG/ACT inhaler Inhale 2 puffs into the lungs every 4 (four) hours as needed for wheezing or shortness of breath. 03/21/17   Lavera Guise, MD  benzonatate (TESSALON) 100 MG capsule Take 1 capsule (100 mg total) by mouth every 8 (eight) hours. 09/30/17   Vernestine Brodhead C, PA-C  cetirizine (ZYRTEC) 10 MG tablet Take 10 mg by mouth daily.    [provider]  cyclobenzaprine (FLEXERIL) 5 MG tablet Take 5 mg by mouth 3 (three) times daily as needed for muscle spasms.    [provider]  docusate sodium (COLACE) 100 MG capsule Take 100 mg by mouth daily.    [provider]  escitalopram (LEXAPRO) 10 MG tablet Take 10 mg by mouth at bedtime.    [provider]  fluticasone (FLONASE) 50 MCG/ACT nasal spray Place 2 sprays into both nostrils daily.    [provider]  Fluticasone-Salmeterol (ADVAIR) 250-50 MCG/DOSE AEPB Inhale 1 puff into the lungs 2 (two) times daily.    [provider]  ketorolac (TORADOL) 10 MG tablet Take 1 tablet (10 mg total) by mouth every 6 (six) hours as needed. 08/04/17   Jacalyn Lefevre, MD  Magnesium 250 MG TABS Take 1 tablet by mouth daily.    [provider]  omeprazole (PRILOSEC) 20 MG capsule Take 20 mg by mouth daily.    [provider]  predniSONE (STERAPRED UNI-PAK 21 TAB) 10 MG (21) TBPK tablet Take 6 tabs by mouth daily  for 2 days, then 5 tabs for 2 days, then 4 tabs for 2 days, then 3 tabs for 2 days, 2 tabs for 2 days, then 1 tab by mouth daily for 2 days 08/04/17   Jacalyn Lefevre, MD  topiramate (TOPAMAX) 100 MG tablet Take 2.5 tablets (250 mg total) by mouth at bedtime. 05/24/17  Everlena Cooper, Adam R, DO  zolmitriptan (ZOMIG) 5 MG nasal solution 1 spray in nose at earliest onset of migraine.  May repeat once in 2 hours if needed. 03/26/17   Drema Dallas, DO  zolmitriptan (ZOMIG) 5 MG nasal solution Place 1 spray into the nose as needed for migraine. 03/26/17   Drema Dallas, DO    Family History History reviewed. No pertinent family history.  Social History Social History  Substance Use Topics  . Smoking status: Never Smoker  . Smokeless tobacco: Never Used  . Alcohol use Not on file     Allergies   Penicillins   Review of Systems Review of Systems  Constitutional: Positive for fever.  HENT: Positive for congestion, ear pain and rhinorrhea.  Negative for ear discharge, sore throat, trouble swallowing and voice change.   Respiratory: Positive for cough and shortness of breath.   Cardiovascular: Negative for chest pain.  Gastrointestinal: Negative for abdominal pain, diarrhea, nausea and vomiting.  Musculoskeletal: Negative for neck stiffness.  Skin: Negative for rash.  Neurological: Negative for dizziness, light-headedness and headaches.  All other systems reviewed and are negative.    Physical Exam Updated Vital Signs BP 134/86 (BP Location: Left Arm)   Pulse 86   Temp 98.8 F (37.1 C) (Oral)   Resp 18   SpO2 99%   Physical Exam  Constitutional: She appears well-developed and well-nourished. No distress.  HENT:  Head: Normocephalic and atraumatic.  Eyes: Conjunctivae are normal.  Neck: Neck supple.  Cardiovascular: Normal rate, regular rhythm, normal heart sounds and intact distal pulses.   Pulmonary/Chest: Effort normal. No respiratory distress. She has decreased breath sounds. She has wheezes (Expiratory wheezes, worse on the right).  Speaks in full sentences without noted difficulty. No noted increased work of breathing.  Abdominal: Soft. There is no tenderness. There is no guarding.  Musculoskeletal: She exhibits no edema.  Lymphadenopathy:    She has no cervical adenopathy.  Neurological: She is alert.  Skin: Skin is warm and dry. Capillary refill takes less than 2 seconds. She is not diaphoretic.  Psychiatric: She has a normal mood and affect. Her behavior is normal.  Nursing note and vitals reviewed.    ED Treatments / Results  Labs (all labs ordered are listed, but only abnormal results are displayed) Labs Reviewed - No data to display  EKG  EKG Interpretation None       Radiology Dg Chest 2 View  Result Date: 09/30/2017 CLINICAL DATA:  31 y/o  F; productive cough with asthma. EXAM: CHEST  2 VIEW COMPARISON:  08/04/2017 chest radiograph FINDINGS: Stable heart size and mediastinal contours  are within normal limits. Both lungs are clear. The visualized skeletal structures are unremarkable. IMPRESSION: No active cardiopulmonary disease. Electronically Signed   By: Mitzi Hansen M.D.   On: 09/30/2017 16:11    Procedures Procedures (including critical care time)  Medications Ordered in ED Medications  ipratropium-albuterol (DUONEB) 0.5-2.5 (3) MG/3ML nebulizer solution 3 mL (3 mLs Nebulization Given 09/30/17 1610)  dexamethasone (DECADRON) injection 10 mg (10 mg Intramuscular Given 09/30/17 1615)  ipratropium-albuterol (DUONEB) 0.5-2.5 (3) MG/3ML nebulizer solution 3 mL (3 mLs Nebulization Given 09/30/17 1655)     Initial Impression / Assessment and Plan / ED Course  I have reviewed the triage vital signs and the nursing notes.  Pertinent labs & imaging results that were available during my care of the patient were reviewed by me and considered in my medical decision making (see chart for details).  Clinical Course as of Oct 02 1543  Wynelle Link Sep 30, 2017  1640 Patient states she is feeling much better. Still coughing, but SOB has improved. Wheezing noted to be more global and expiratory. No longer diminished.   [SJ]  1729 Patient states she continues to feel better. Some minor wheezing still persists. Maintains excellent SPO2 on room air.   [SJ]    Clinical Course User Index [SJ] Ezreal Turay C, PA-C    Patient presents with symptoms consistent with upper respiratory infection, viral etiology most common.  Patient is nontoxic appearing, afebrile, not tachycardic, not tachypneic, not hypotensive, maintains SPO2 of 99-100% on room air, and is in no apparent distress. Patient's lung sounds significantly improved and her subjective feelings of shortness of breath resolved with treatment here in the ED. CXR free from signs of acute infiltrate or other acute abnormality.  Patient can no longer go to her previous PCP or pulmonologist due to loss of insurance. Case management  consult placed to help patient secure routine care for her asthma.  The patient was given instructions for home care as well as return precautions. Patient voices understanding of these instructions, accepts the plan, and is comfortable with discharge.  Vitals:   09/30/17 1336 09/30/17 1610 09/30/17 1623  BP: 134/86  129/85  Pulse: 86 77 82  Resp: Temp: 98.8 F (37.1 C)    TempSrc: Oral    SpO2: 99% 100% 99%     Final Clinical Impressions(s) / ED Diagnoses   Final diagnoses:  Upper respiratory tract infection, unspecified type    New Prescriptions Discharge Medication List as of 09/30/2017  5:33 PM    START taking these medications   Details  benzonatate (TESSALON) 100 MG capsule Take 1 capsule (100 mg total) by mouth every 8 (eight) hours., Starting Sun 09/30/2017, Print         Keyia Moretto C, PA-C 10/01/17 1547    Rolan Bucco, MD 10/01/17 1610

## 2017-10-05 ENCOUNTER — Emergency Department (HOSPITAL_COMMUNITY): Payer: Self-pay

## 2017-10-05 ENCOUNTER — Emergency Department (HOSPITAL_COMMUNITY)
Admission: EM | Admit: 2017-10-05 | Discharge: 2017-10-06 | Disposition: A | Discharge status: Home / Self Care | Payer: Self-pay | Attending: Emergency Medicine | Admitting: Emergency Medicine

## 2017-10-05 ENCOUNTER — Encounter (HOSPITAL_COMMUNITY): Payer: Self-pay | Admitting: Emergency Medicine

## 2017-10-05 DIAGNOSIS — H6692 Otitis media, unspecified, left ear: Secondary | ICD-10-CM | POA: Insufficient documentation

## 2017-10-05 DIAGNOSIS — Z79899 Other long term (current) drug therapy: Secondary | ICD-10-CM | POA: Insufficient documentation

## 2017-10-05 DIAGNOSIS — H6690 Otitis media, unspecified, unspecified ear: Secondary | ICD-10-CM

## 2017-10-05 DIAGNOSIS — J45901 Unspecified asthma with (acute) exacerbation: Secondary | ICD-10-CM | POA: Insufficient documentation

## 2017-10-05 LAB — I-STAT TROPONIN, ED: Troponin i, poc: 0.01 ng/mL (ref 0.00–0.08)

## 2017-10-05 LAB — D-DIMER, QUANTITATIVE (NOT AT ARMC): D-Dimer, Quant: <0.27 ug/mL-FEU (ref 0.00–0.50)

## 2017-10-05 MED ORDER — LORAZEPAM 2 MG/ML IJ SOLN
0.5000 mg | Freq: Once | INTRAMUSCULAR | Status: AC
  Administered 2017-10-05: 0.5 mg via INTRAVENOUS
  Filled 2017-10-05: qty 1

## 2017-10-05 MED ORDER — IPRATROPIUM BROMIDE 0.02 % IN SOLN
0.5000 mg | Freq: Once | RESPIRATORY_TRACT | Status: AC
  Administered 2017-10-05: 0.5 mg via RESPIRATORY_TRACT

## 2017-10-05 MED ORDER — ALBUTEROL (5 MG/ML) CONTINUOUS INHALATION SOLN
10.0000 mg/h | INHALATION_SOLUTION | RESPIRATORY_TRACT | Status: DC
  Administered 2017-10-05: 10 mg/h via RESPIRATORY_TRACT

## 2017-10-05 MED ORDER — PREDNISONE 10 MG (21) PO TBPK: ORAL_TABLET | ORAL | 0 refills | Status: AC

## 2017-10-05 MED ORDER — AZITHROMYCIN 250 MG PO TABS: 250.0000 mg | ORAL_TABLET | Freq: Every day | ORAL | 0 refills | Status: DC

## 2017-10-05 MED ORDER — PREDNISONE 20 MG PO TABS
60.0000 mg | ORAL_TABLET | Freq: Once | ORAL | Status: AC
  Administered 2017-10-05: 60 mg via ORAL
  Filled 2017-10-05: qty 3

## 2017-10-05 NOTE — ED Notes (Signed)
Patient transported to X-ray 

## 2017-10-05 NOTE — ED Notes (Signed)
Bed: [facility]95 Expected date:  Expected time:  Means of arrival:  Comments: Triage pt

## 2017-10-05 NOTE — ED Provider Notes (Signed)
[facility]-EMERGENCY DEPT Provider Note   CSN: [id number] Arrival date & time: 10/05/17  2002     History   Chief Complaint No chief complaint on file.   HPI Jasmin Hayes is a 31 y.o. female.  HPI    31 year old female presents today with complaints of asthma.  Patient notes that symptoms started approximately 5 days ago with upper respiratory congestion, cough, wheezing and left ear pain.  Patient notes using his home nebulizer treatments without improvement.  She was seen in the emergency room on 09/30/2017 (5 days ago) she was treated with an injection of steroids, breathing treatments and discharge.  Patient notes symptoms worsening at home with worsening wheezing despite nebulization at home.  Patient reports originally she had a fever, no fever presently.  She reports pain in her left ear.  She reports pain with coughing in the chest.  No lower extremity edema.   Past Medical History:  Diagnosis Date  . Asthma   . Migraines     There are no active problems to display for this patient.   Past Surgical History:  Procedure Laterality Date  . BACK SURGERY      OB History    No data available       Home Medications    Prior to Admission medications   Medication Sig Start Date End Date Taking? Authorizing Provider  acetaminophen (TYLENOL) 325 MG tablet Take 650 mg by mouth every 6 (six) hours as needed for moderate pain.   Yes [provider]  albuterol (PROVENTIL HFA;VENTOLIN HFA) 108 (90 Base) MCG/ACT inhaler Inhale 2 puffs into the lungs every 4 (four) hours as needed for wheezing or shortness of breath. 03/21/17  Yes Lavera Guise, MD  ALPRAZolam Prudy Feeler) 0.25 MG tablet Take 0.25 mg by mouth daily as needed for anxiety. 09/20/17  Yes [provider]  benzonatate (TESSALON) 100 MG capsule Take 1 capsule (100 mg total) by mouth every 8 (eight) hours. 09/30/17  Yes Joy, Shawn C, PA-C  cyclobenzaprine (FLEXERIL) 5 MG tablet Take 5 mg by mouth 3 (three)  times daily as needed for muscle spasms.   Yes [provider]  docusate sodium (COLACE) 100 MG capsule Take 100 mg by mouth daily.   Yes [provider]  escitalopram (LEXAPRO) 10 MG tablet Take 10 mg by mouth at bedtime.   Yes [provider]  fexofenadine-pseudoephedrine (ALLEGRA-D) 60-120 MG 12 hr tablet Take 1 tablet by mouth 2 (two) times daily.   Yes [provider]  fluticasone (FLONASE) 50 MCG/ACT nasal spray Place 2 sprays into both nostrils daily.   Yes [provider]  Fluticasone-Salmeterol (ADVAIR) 500-50 MCG/DOSE AEPB Inhale 1 puff into the lungs 2 (two) times daily.   Yes [provider]  guaiFENesin (MUCINEX) 600 MG 12 hr tablet Take 600 mg by mouth 2 (two) times daily.   Yes [provider]  Magnesium 250 MG TABS Take 1 tablet by mouth daily.   Yes [provider]  omeprazole (PRILOSEC) 20 MG capsule Take 20 mg by mouth daily.   Yes [provider]  tetrahydrozoline-zinc (VISINE-AC) 0.05-0.25 % ophthalmic solution Place 2 drops into both eyes 3 (three) times daily as needed (allergies).   Yes [provider]  Tiotropium Bromide Monohydrate (SPIRIVA RESPIMAT) 1.25 MCG/ACT AERS Inhale 2 puffs into the lungs daily.   Yes [provider]  topiramate (TOPAMAX) 100 MG tablet Take 2.5 tablets (250 mg total) by mouth at bedtime. Patient taking differently: Take 200  mg by mouth at bedtime.  05/24/17  Yes Jaffe, Adam R, DO  azithromycin (ZITHROMAX) 250 MG tablet Take 1 tablet (250 mg total) by mouth daily. Take first 2 tablets together, then 1 every day until finished. 10/05/17   Ailish Prospero, Tinnie Gens, PA-C  ketorolac (TORADOL) 10 MG tablet Take 1 tablet (10 mg total) by mouth every 6 (six) hours as needed. Patient not taking: Reported on 10/05/2017 08/04/17   Jacalyn Lefevre, MD  predniSONE (STERAPRED UNI-PAK 21 TAB) 10 MG (21) TBPK tablet Take 6 tabs by mouth daily  for 2 days, then 5 tabs for 2 days,  then 4 tabs for 2 days, then 3 tabs for 2 days, 2 tabs for 2 days, then 1 tab by mouth daily for 2 days 10/05/17   Payten Beaumier, Tinnie Gens, PA-C  zolmitriptan (ZOMIG) 5 MG nasal solution 1 spray in nose at earliest onset of migraine.  May repeat once in 2 hours if needed. 03/26/17   Drema Dallas, DO  zolmitriptan (ZOMIG) 5 MG nasal solution Place 1 spray into the nose as needed for migraine. Patient not taking: Reported on 10/05/2017 03/26/17   Drema Dallas, DO    Family History No family history on file.  Social History Social History  Substance Use Topics  . Smoking status: Never Smoker  . Smokeless tobacco: Never Used  . Alcohol use Not on file     Allergies   Penicillins   Review of Systems Review of Systems  All other systems reviewed and are negative.    Physical Exam Updated Vital Signs BP 109/74 (BP Location: Right Arm)   Pulse (!) 112   Temp 98.2 F (36.8 C) (Oral)   Resp 18   LMP 09/23/2017 (Approximate)   SpO2 98%   Physical Exam  Constitutional: She is oriented to person, place, and time. She appears well-developed and well-nourished.  HENT:  Head: Normocephalic and atraumatic.  Nasal congestion-cough  Left otitis media  Eyes: Pupils are equal, round, and reactive to light. Conjunctivae are normal. Right eye exhibits no discharge. Left eye exhibits no discharge. No scleral icterus.  Neck: Normal range of motion. No JVD present. No tracheal deviation present.  Cardiovascular: Regular rhythm and normal heart sounds.   Pulmonary/Chest: Effort normal. No stridor. No respiratory distress. She has no wheezes. She has no rales. She exhibits no tenderness.  Tachypnea-high-pitched forced expiratory wheeze-poor effort by patient  Musculoskeletal: She exhibits no edema.  Neurological: She is alert and oriented to person, place, and time. Coordination normal.  Skin: Skin is warm.  Psychiatric: She has a normal mood and affect. Her behavior is normal. Judgment and thought  content normal.  Nursing note and vitals reviewed.    ED Treatments / Results  Labs (all labs ordered are listed, but only abnormal results are displayed) Labs Reviewed  CBC WITH DIFFERENTIAL/PLATELET  BASIC METABOLIC PANEL  D-DIMER, QUANTITATIVE (NOT AT [facility])  I-STAT TROPONIN, ED    EKG  EKG Interpretation None       Radiology Dg Chest 2 View  Result Date: 10/05/2017 CLINICAL DATA:  COUGH- SOB- Wheeze EXAM: CHEST  2 VIEW COMPARISON:  09/30/17 FINDINGS: The lungs are clear. The pulmonary vasculature is normal. Heart size is normal. Hilar and mediastinal contours are unremarkable. There is no pleural effusion. IMPRESSION: No active cardiopulmonary disease. Electronically Signed   By: Ellery Plunk M.D.   On: 10/05/2017 21:19    Procedures Procedures (including critical care time)  Medications Ordered in ED Medications  albuterol (PROVENTIL,VENTOLIN)  solution continuous neb (0 mg/hr Nebulization Stopped 10/05/17 2116)  ipratropium (ATROVENT) nebulizer solution 0.5 mg (0.5 mg Nebulization Given 10/05/17 2030)  predniSONE (DELTASONE) tablet 60 mg (60 mg Oral Given 10/05/17 2123)  LORazepam (ATIVAN) injection 0.5 mg (0.5 mg Intravenous Given 10/05/17 2301)     Initial Impression / Assessment and Plan / ED Course  I have reviewed the triage vital signs and the nursing notes.  Pertinent labs & imaging results that were available during my care of the patient were reviewed by me and considered in my medical decision making (see chart for details).      Final Clinical Impressions(s) / ED Diagnoses   Final diagnoses:  Exacerbation of asthma, unspecified asthma severity, unspecified whether persistent  Acute otitis media, unspecified otitis media type   Labs: CBC, BMP d-dimer  Imaging: DG Chest 2 View  Consults:  Therapeutics: Ativan, albuterol, Atrovent, prednisone   Discharge Meds: Azithromycin, prednisone  Assessment/Plan: 31 year old female presents today  with complaints of asthma exacerbation.  This is her second visit in the last week.  Patient reporting extreme shortness of breath and wheeze.  On my exam she was giving poor effort, tachypneic.  She had no acute wheeze, she was already started on a hour-long breathing treatment.  I pause this exam and ambulated her personally, she was easily distracted with full sentences, maintained near perfect oxygen saturation.  Patient reports she is having chest pain located in the front lower chest.  She notes this is worse with deep inspiration also describing pressure on her chest.  Patient is requesting hospital admission for her asthma exacerbation.  I explained to her that she has normal lung sounds, near perfect oxygen saturation, she is concerned with the chest pain.  My suspicion for ACS is very low in this patient, low suspicion for PE, but given persistent reported shortness of breath, tachycardia which could be caused by the albuterol, and pleuritic chest pain PE in the differential.  Patient will have a d-dimer performed here.  If d-dimer is elevated she will proceed with CT scan, if her d-dimer is negative I have reassured that this is not a pulmonary embolism likely muscular in nature secondary to recent asthma exacerbation and patient will be safe for discharge with oral prednisone and antibiotics.  Patient is allergic to amoxicillin, azithromycin will be used for her otitis media.  Care will be signed to oncoming provider pending d-dimer and subsequent disposition.   New Prescriptions New Prescriptions   AZITHROMYCIN (ZITHROMAX) 250 MG TABLET    Take 1 tablet (250 mg total) by mouth daily. Take first 2 tablets together, then 1 every day until finished.     Eyvonne Mechanic, PA-C 10/05/17 2315    Bethann Berkshire, MD 10/06/17 [phone]

## 2017-10-05 NOTE — ED Triage Notes (Signed)
Came in with complaint of SOB , pt. Has productive cough, was seen here with the same problem on the 7th. Pt. Has asthma. Alert and oriented x4.

## 2017-10-05 NOTE — Discharge Instructions (Addendum)
Your labs today were normal. Please read attached information. If you experience any new or worsening signs or symptoms please return to the emergency room for evaluation. Please follow-up with your primary care provider or specialist as discussed. Please use medication prescribed only as directed and discontinue taking if you have any concerning signs or symptoms.

## 2017-10-06 LAB — BASIC METABOLIC PANEL
Anion gap: 14 (ref 5–15)
BUN: 10 mg/dL (ref 6–20)
CO2: 18 mmol/L — ABNORMAL LOW (ref 22–32)
Calcium: 9.8 mg/dL (ref 8.9–10.3)
Chloride: 106 mmol/L (ref 101–111)
Creatinine, Ser: 0.7 mg/dL (ref 0.44–1.00)
GFR calc Af Amer: >60 mL/min (ref >60)
GFR calc non Af Amer: >60 mL/min (ref >60)
Glucose, Bld: 92 mg/dL (ref 65–99)
Potassium: 4.2 mmol/L (ref 3.5–5.1)
Sodium: 138 mmol/L (ref 135–145)

## 2017-10-06 LAB — CBC WITH DIFFERENTIAL/PLATELET
Basophils Absolute: 0 10*3/uL (ref 0.0–0.1)
Basophils Relative: 0 %
Eosinophils Absolute: 0.2 10*3/uL (ref 0.0–0.7)
Eosinophils Relative: 2 %
HCT: 43.7 % (ref 36.0–46.0)
Hemoglobin: 15.5 g/dL — ABNORMAL HIGH (ref 12.0–15.0)
Lymphocytes Relative: 40 %
Lymphs Abs: 5.3 10*3/uL — ABNORMAL HIGH (ref 0.7–4.0)
MCH: 31.6 pg (ref 26.0–34.0)
MCHC: 35.5 g/dL (ref 30.0–36.0)
MCV: 89 fL (ref 78.0–100.0)
Monocytes Absolute: 1 10*3/uL (ref 0.1–1.0)
Monocytes Relative: 8 %
Neutro Abs: 6.7 10*3/uL (ref 1.7–7.7)
Neutrophils Relative %: 50 %
Platelets: 426 10*3/uL — ABNORMAL HIGH (ref 150–400)
RBC: 4.91 MIL/uL (ref 3.87–5.11)
RDW: 12.7 % (ref 11.5–15.5)
WBC: 13.2 10*3/uL — ABNORMAL HIGH (ref 4.0–10.5)

## 2017-10-06 NOTE — ED Provider Notes (Signed)
Assumed care from PA hedges at shift change.  See his note for full H&P.  Briefly, 31 y.o. F here with SOB and asthma exacerbation.  Seen here earlier in the week for same.  CXR clear.  Has had nebs and ambulated in the hall with normal O2 sats.  After walking, complained of chest pain that seemed pleuritic.  No cardiac hx.  Plan:  Labs included CBC, BMP, d-dimer, and troponin.  Results for orders placed or performed during the hospital encounter of 10/05/17  CBC with Differential  Result Value Ref Range   WBC 13.2 (H) 4.0 - 10.5 K/uL   RBC 4.91 3.87 - 5.11 MIL/uL   Hemoglobin 15.5 (H) 12.0 - 15.0 g/dL   HCT 82.9 56.2 - 13.0 %   MCV 89.0 78.0 - 100.0 fL   MCH 31.6 26.0 - 34.0 pg   MCHC 35.5 30.0 - 36.0 g/dL   RDW 86.5 78.4 - 69.6 %   Platelets 426 (H) 150 - 400 K/uL   Neutrophils Relative % 50 %   Neutro Abs 6.7 1.7 - 7.7 K/uL   Lymphocytes Relative 40 %   Lymphs Abs 5.3 (H) 0.7 - 4.0 K/uL   Monocytes Relative 8 %   Monocytes Absolute 1.0 0.1 - 1.0 K/uL   Eosinophils Relative 2 %   Eosinophils Absolute 0.2 0.0 - 0.7 K/uL   Basophils Relative 0 %   Basophils Absolute 0.0 0.0 - 0.1 K/uL  Basic metabolic panel  Result Value Ref Range   Sodium 138 135 - 145 mmol/L   Potassium 4.2 3.5 - 5.1 mmol/L   Chloride 106 101 - 111 mmol/L   CO2 18 (L) 22 - 32 mmol/L   Glucose, Bld 92 65 - 99 mg/dL   BUN 10 6 - 20 mg/dL   Creatinine, Ser 2.95 0.44 - 1.00 mg/dL   Calcium 9.8 8.9 - 28.4 mg/dL   GFR calc non Af Amer >60 >60 mL/min   GFR calc Af Amer >60 >60 mL/min   Anion gap 14 5 - 15  D-dimer, quantitative (not at [facility])  Result Value Ref Range   D-Dimer, Quant <0.27 0.00 - 0.50 ug/mL-FEU  I-Stat Troponin, ED (not at [facility])  Result Value Ref Range   Troponin i, poc 0.01 0.00 - 0.08 ng/mL   Comment 3           Dg Chest 2 View  Result Date: 10/05/2017 CLINICAL DATA:  COUGH- SOB- Wheeze EXAM: CHEST  2 VIEW COMPARISON:  09/30/17 FINDINGS: The lungs are clear. The pulmonary vasculature is  normal. Heart size is normal. Hilar and mediastinal contours are unremarkable. There is no pleural effusion. IMPRESSION: No active cardiopulmonary disease. Electronically Signed   By: Ellery Plunk M.D.   On: 10/05/2017 21:19    12:32 AM Labs reassuring.  D-dimer and trop negative.  O2 sats remain stable here. Without hypoxia, do not feel she requires admission at this time.  Will d/c home with azithromycin and prednisone taper as per PA Hedges.  Patient to follow-up with PCP.  She understands to return here for any new/worsening symptoms.   Garlon Hatchet, PA-C 10/06/17 0239    Lorre Nick, MD 10/07/17 [phone]

## 2017-10-12 ENCOUNTER — Ambulatory Visit (INDEPENDENT_AMBULATORY_CARE_PROVIDER_SITE_OTHER): Payer: BLUE CROSS/BLUE SHIELD | Admitting: Physician Assistant

## 2017-11-02 ENCOUNTER — Encounter (INDEPENDENT_AMBULATORY_CARE_PROVIDER_SITE_OTHER): Payer: Self-pay | Admitting: Physician Assistant

## 2017-11-02 ENCOUNTER — Other Ambulatory Visit: Payer: Self-pay

## 2017-11-02 ENCOUNTER — Ambulatory Visit (INDEPENDENT_AMBULATORY_CARE_PROVIDER_SITE_OTHER): Payer: Self-pay | Admitting: Physician Assistant

## 2017-11-02 VITALS — BP 126/81 | HR 99 | Temp 98.3°F | Wt 211.0 lb

## 2017-11-02 DIAGNOSIS — Z760 Encounter for issue of repeat prescription: Secondary | ICD-10-CM

## 2017-11-02 DIAGNOSIS — J4550 Severe persistent asthma, uncomplicated: Secondary | ICD-10-CM

## 2017-11-02 DIAGNOSIS — F319 Bipolar disorder, unspecified: Secondary | ICD-10-CM

## 2017-11-02 DIAGNOSIS — F411 Generalized anxiety disorder: Secondary | ICD-10-CM

## 2017-11-02 MED ORDER — SUMATRIPTAN 5 MG/ACT NA SOLN: NASAL | 2 refills | Status: AC

## 2017-11-02 MED ORDER — ALBUTEROL SULFATE HFA 108 (90 BASE) MCG/ACT IN AERS: 2.0000 | INHALATION_SPRAY | RESPIRATORY_TRACT | 2 refills | Status: AC | PRN

## 2017-11-02 MED ORDER — TOPIRAMATE 100 MG PO TABS: 200.0000 mg | ORAL_TABLET | Freq: Every day | ORAL | 1 refills | Status: DC

## 2017-11-02 MED ORDER — CLONAZEPAM 0.5 MG PO TABS: 0.5000 mg | ORAL_TABLET | Freq: Two times a day (BID) | ORAL | 0 refills | Status: AC | PRN

## 2017-11-02 MED ORDER — ZOLMITRIPTAN 5 MG NA SOLN: NASAL | 3 refills | Status: DC

## 2017-11-02 MED ORDER — TIOTROPIUM BROMIDE MONOHYDRATE 1.25 MCG/ACT IN AERS: 2.0000 | INHALATION_SPRAY | Freq: Every day | RESPIRATORY_TRACT | 3 refills | Status: AC

## 2017-11-02 MED ORDER — RISPERIDONE 2 MG PO TABS: 2.0000 mg | ORAL_TABLET | Freq: Every day | ORAL | 1 refills | Status: AC

## 2017-11-02 MED ORDER — MONTELUKAST SODIUM 10 MG PO TABS: 10.0000 mg | ORAL_TABLET | Freq: Every day | ORAL | 3 refills | Status: AC

## 2017-11-02 MED ORDER — FLUTICASONE-SALMETEROL 500-50 MCG/DOSE IN AEPB
1.0000 | INHALATION_SPRAY | Freq: Two times a day (BID) | RESPIRATORY_TRACT | 3 refills | Status: AC
Start: 2017-11-02 — End: ?

## 2017-11-02 NOTE — Patient Instructions (Signed)
Bipolar 1 Disorder Bipolar 1 disorder is a mental health disorder in which a person has episodes of emotional highs (mania), and may also have episodes of emotional lows (depression) in addition to highs. Bipolar 1 disorder is different from other bipolar disorders because it involves extreme manic episodes. These episodes last at least one week or involve symptoms that are so severe that hospitalization is needed to keep the person safe. What increases the risk? The cause of this condition is not known. However, certain factors make you more likely to have bipolar disorder, such as:  Having a family member with the disorder.  An imbalance of certain chemicals in the brain (neurotransmitters).  Stress, such as illness, financial problems, or a death.  Certain conditions that affect the brain or spinal cord (neurologic conditions).  Brain injury (trauma).  Having another mental health disorder, such as: ? Obsessive compulsive disorder. ? Schizophrenia.  What are the signs or symptoms? Symptoms of mania include:  Very high self-esteem or self-confidence.  Decreased need for sleep.  Unusual talkativeness or feeling a need to keep talking. Speech may be very fast. It may seem like you cannot stop talking.  Racing thoughts or constant talking, with quick shifts between topics that may or may not be related (flight of ideas).  Decreased ability to focus or concentrate.  Increased purposeful activity, such as work, studies, or social activity.  Increased nonproductive activity. This could be pacing, squirming and fidgeting, or finger and toe tapping.  Impulsive behavior and poor judgment. This may result in high-risk activities, such as having unprotected sex or spending a lot of money.  Symptoms of depression include:  Feeling sad, hopeless, or helpless.  Frequent or uncontrollable crying.  Lack of feeling or caring about anything.  Sleeping too much.  Moving more slowly  than usual.  Not being able to enjoy things you used to enjoy.  Wanting to be alone all the time.  Feeling guilty or worthless.  Lack of energy or motivation.  Trouble concentrating or remembering.  Trouble making decisions.  Increased appetite.  Thoughts of death, or the desire to harm yourself.  Sometimes, you may have a mixed mood. This means having symptoms of depression and mania. Stress can make symptoms worse. How is this diagnosed? To diagnose bipolar disorder, your health care provider may ask about your:  Emotional episodes.  Medical history.  Alcohol and drug use. This includes prescription medicines. Certain medical conditions and substances can cause symptoms that seem like bipolar disorder (secondary bipolar disorder).  How is this treated? Bipolar disorder is a long-term (chronic) illness. It is best controlled with ongoing (continuous) treatment rather than treatment only when symptoms occur. Treatment may include:  Medicine. Medicine can be prescribed by a provider who specializes in treating mental disorders (psychiatrist). ? Medicines called mood stabilizers are usually prescribed. ? If symptoms occur even while taking a mood stabilizer, other medicines may be added.  Psychotherapy. Some forms of talk therapy, such as cognitive-behavioral therapy (CBT), can provide support, education, and guidance.  Coping methods, such as journaling or relaxation exercises. These may include: ? Yoga. ? Meditation. ? Deep breathing.  Lifestyle changes, such as: ? Limiting alcohol and drug use. ? Exercising regularly. ? Getting plenty of sleep. ? Making healthy eating choices.  A combination of medicine, talk therapy, and coping methods is best. A procedure in which electricity is applied to the brain through the scalp (electroconvulsive therapy) may be used in cases of severe mania when medicine   and psychotherapy work too slowly or do not work. Follow these  instructions at home: Activity   Return to your normal activities as told by your health care provider.  Find activities that you enjoy, and make time to do them.  Exercise regularly as told by your health care provider. Lifestyle  Limit alcohol intake to no more than 1 drink a day for nonpregnant women and 2 drinks a day for men. One drink equals 12 oz of beer, 5 oz of wine, or 1 oz of hard liquor.  Follow a set schedule for eating and sleeping.  Eat a balanced diet that includes fresh fruits and vegetables, whole grains, low-fat dairy, and lean meat.  Get 7-8 hours of sleep each night. General instructions  Take over-the-counter and prescription medicines only as told by your health care provider.  Think about joining a support group. Your health care provider may be able to recommend a support group.  Talk with your family and loved ones about your treatment goals and how they can help.  Keep all follow-up visits as told by your health care provider. This is important. Where to find more information: For more information about bipolar disorder, visit the following websites:  National Alliance on Mental Illness: www.nami.org  [place] National Institute of Mental Health: www.nimh.nih.gov  Contact a health care provider if:  Your symptoms get worse.  You have side effects from your medicine, and they get worse.  You have trouble sleeping.  You have trouble doing daily activities.  You feel unsafe in your surroundings.  You are dealing with substance abuse. Get help right away if:  You have new symptoms.  You have thoughts about harming yourself.  You self-harm. This information is not intended to replace advice given to you by your health care provider. Make sure you discuss any questions you have with your health care provider. Document Released: 03/19/2001 Document Revised: 08/06/2016 Document Reviewed: 08/10/2016 Elsevier Interactive Patient Education  2018  [employer]  
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
## 2017-11-03 LAB — COMPREHENSIVE METABOLIC PANEL
ALBUMIN: 4.3 g/dL (ref 3.5–5.5)
ALK PHOS: 89 IU/L (ref 39–117)
ALT: 12 IU/L (ref 0–32)
AST: 16 IU/L (ref 0–40)
Albumin/Globulin Ratio: 1.7 (ref 1.2–2.2)
BUN / CREAT RATIO: 16 (ref 9–23)
BUN: 11 mg/dL (ref 6–20)
Bilirubin Total: <0.2 mg/dL (ref 0.0–1.2)
CO2: 23 mmol/L (ref 20–29)
CREATININE: 0.69 mg/dL (ref 0.57–1.00)
Calcium: 9.2 mg/dL (ref 8.7–10.2)
Chloride: 108 mmol/L — ABNORMAL HIGH (ref 96–106)
GFR calc non Af Amer: 117 mL/min/{1.73_m2} (ref >59)
GFR, EST AFRICAN AMERICAN: 135 mL/min/{1.73_m2} (ref >59)
GLUCOSE: 104 mg/dL — AB (ref 65–99)
Globulin, Total: 2.5 g/dL (ref 1.5–4.5)
Potassium: 4.1 mmol/L (ref 3.5–5.2)
Sodium: 142 mmol/L (ref 134–144)
TOTAL PROTEIN: 6.8 g/dL (ref 6.0–8.5)

## 2017-11-03 LAB — CBC WITH DIFFERENTIAL/PLATELET
BASOS: 1 %
Basophils Absolute: 0 10*3/uL (ref 0.0–0.2)
EOS (ABSOLUTE): 0.1 10*3/uL (ref 0.0–0.4)
Eos: 2 %
HEMOGLOBIN: 13 g/dL (ref 11.1–15.9)
Hematocrit: 39.2 % (ref 34.0–46.6)
IMMATURE GRANS (ABS): 0 10*3/uL (ref 0.0–0.1)
Immature Granulocytes: 0 %
LYMPHS ABS: 1.8 10*3/uL (ref 0.7–3.1)
LYMPHS: 29 %
MCH: 30.5 pg (ref 26.6–33.0)
MCHC: 33.2 g/dL (ref 31.5–35.7)
MCV: 92 fL (ref 79–97)
Monocytes Absolute: 0.4 10*3/uL (ref 0.1–0.9)
Monocytes: 7 %
NEUTROS ABS: 3.7 10*3/uL (ref 1.4–7.0)
Neutrophils: 61 %
PLATELETS: 318 10*3/uL (ref 150–379)
RBC: 4.26 x10E6/uL (ref 3.77–5.28)
RDW: 13.7 % (ref 12.3–15.4)
WBC: 6 10*3/uL (ref 3.4–10.8)

## 2017-11-03 LAB — TSH: TSH: 1.48 u[IU]/mL (ref 0.450–4.500)

## 2017-11-07 ENCOUNTER — Telehealth (INDEPENDENT_AMBULATORY_CARE_PROVIDER_SITE_OTHER): Payer: Self-pay

## 2017-11-07 NOTE — Telephone Encounter (Signed)
Left message asking patient to call the office. [name], CMA  

## 2017-11-07 NOTE — Telephone Encounter (Signed)
-----   Message from Loletta Specteroger David Gomez, PA-C sent at 11/05/2017  8:23 AM EST ----- Labs are normal/unremarkable.

## 2017-11-08 ENCOUNTER — Telehealth (INDEPENDENT_AMBULATORY_CARE_PROVIDER_SITE_OTHER): Payer: Self-pay

## 2017-11-08 NOTE — Telephone Encounter (Signed)
Attempted to call patient and partner and no answer from either. Will mail results. Maryjean Mornempestt S Roberts, CMA

## 2017-11-19 ENCOUNTER — Encounter (INDEPENDENT_AMBULATORY_CARE_PROVIDER_SITE_OTHER): Payer: Self-pay | Admitting: Physician Assistant

## 2017-11-19 ENCOUNTER — Ambulatory Visit (INDEPENDENT_AMBULATORY_CARE_PROVIDER_SITE_OTHER): Payer: Self-pay | Admitting: Physician Assistant

## 2017-11-19 ENCOUNTER — Other Ambulatory Visit: Payer: Self-pay

## 2017-11-19 VITALS — BP 95/69 | HR 87 | Temp 98.0°F | Wt 213.2 lb

## 2017-11-19 DIAGNOSIS — J45901 Unspecified asthma with (acute) exacerbation: Secondary | ICD-10-CM

## 2017-11-19 DIAGNOSIS — J029 Acute pharyngitis, unspecified: Secondary | ICD-10-CM

## 2017-11-19 DIAGNOSIS — R059 Cough, unspecified: Secondary | ICD-10-CM

## 2017-11-19 DIAGNOSIS — J209 Acute bronchitis, unspecified: Secondary | ICD-10-CM

## 2017-11-19 DIAGNOSIS — R05 Cough: Secondary | ICD-10-CM

## 2017-11-19 MED ORDER — DOXYCYCLINE HYCLATE 100 MG PO TABS: 100.0000 mg | ORAL_TABLET | Freq: Two times a day (BID) | ORAL | 0 refills | Status: AC

## 2017-11-19 MED ORDER — IPRATROPIUM-ALBUTEROL 0.5-2.5 (3) MG/3ML IN SOLN
3.0000 mL | Freq: Once | RESPIRATORY_TRACT | Status: AC
  Administered 2017-11-19: 3 mL via RESPIRATORY_TRACT

## 2017-11-19 MED ORDER — PHENYLEPHRINE-DM-GG-APAP 5-10-200-325 MG/10ML PO LIQD: 20.0000 mL | ORAL | 0 refills | Status: AC

## 2017-11-19 MED ORDER — METHYLPREDNISOLONE SODIUM SUCC 125 MG IJ SOLR
125.0000 mg | Freq: Once | INTRAMUSCULAR | Status: AC
  Administered 2017-11-19: 125 mg via INTRAMUSCULAR

## 2017-11-19 NOTE — Progress Notes (Signed)
Subjective:  Patient ID: Jasmin Hayes, female    DOB: February 28, 1986  Age: 31 y.o. MRN: [id number]  CC: sore throat  HPI Jasmin Hayes is a 31 y.o. female with a medical history of Bipolar 1, GERD, asthma and migraines presents with sore throat, sinus pressure, cough, wheeze, and laryngitis x2 days. No close contacts with the same. Has not taken anything for relief. Denies fever, chills, rash, CP, palpitations, abdominal pain, nausea, vomiting, or GI/GU sxs.    Last seen here 17 days ago and was prescribed Risperdal 2 mg qday for Bipolar 1 disorder. Reports having started Risperdal one week ago. Has not felt any different as of yet.   Outpatient Medications Prior to Visit  Medication Sig Dispense Refill  . acetaminophen (TYLENOL) 325 MG tablet Take 650 mg by mouth every 6 (six) hours as needed for moderate pain.    Marland Kitchen albuterol (PROVENTIL HFA;VENTOLIN HFA) 108 (90 Base) MCG/ACT inhaler Inhale 2 puffs every 4 (four) hours as needed into the lungs for wheezing or shortness of breath. 1 Inhaler 2  . clonazePAM (KLONOPIN) 0.5 MG tablet Take 1 tablet (0.5 mg total) 2 (two) times daily as needed by mouth for anxiety. 30 tablet 0  . cyclobenzaprine (FLEXERIL) 5 MG tablet Take 5 mg by mouth 3 (three) times daily as needed for muscle spasms.    Marland Kitchen docusate sodium (COLACE) 100 MG capsule Take 100 mg by mouth daily.    . fluticasone (FLONASE) 50 MCG/ACT nasal spray Place 2 sprays into both nostrils daily.    . Fluticasone-Salmeterol (ADVAIR) 500-50 MCG/DOSE AEPB Inhale 1 puff 2 (two) times daily into the lungs. 60 each 3  . guaiFENesin (MUCINEX) 600 MG 12 hr tablet Take 600 mg by mouth 2 (two) times daily.    . montelukast (SINGULAIR) 10 MG tablet Take 1 tablet (10 mg total) at bedtime by mouth. 30 tablet 3  . omeprazole (PRILOSEC) 20 MG capsule Take 20 mg by mouth daily.    . SUMAtriptan (IMITREX) 5 MG/ACT nasal spray One spray in one nostril, may be repeated in two hours if necessary. 1 Inhaler 2  .  fexofenadine-pseudoephedrine (ALLEGRA-D) 60-120 MG 12 hr tablet Take 1 tablet by mouth 2 (two) times daily.    Marland Kitchen ketorolac (TORADOL) 10 MG tablet Take 1 tablet (10 mg total) by mouth every 6 (six) hours as needed. (Patient not taking: Reported on 11/19/2017) 10 tablet 0  . Magnesium 250 MG TABS Take 1 tablet by mouth daily.    . predniSONE (STERAPRED UNI-PAK 21 TAB) 10 MG (21) TBPK tablet Take 6 tabs by mouth daily  for 2 days, then 5 tabs for 2 days, then 4 tabs for 2 days, then 3 tabs for 2 days, 2 tabs for 2 days, then 1 tab by mouth daily for 2 days (Patient not taking: Reported on 11/19/2017) 42 tablet 0  . risperiDONE (RISPERDAL) 2 MG tablet Take 1 tablet (2 mg total) at bedtime by mouth. 30 tablet 1  . tetrahydrozoline-zinc (VISINE-AC) 0.05-0.25 % ophthalmic solution Place 2 drops into both eyes 3 (three) times daily as needed (allergies).    . Tiotropium Bromide Monohydrate (SPIRIVA RESPIMAT) 1.25 MCG/ACT AERS Inhale 2 puffs daily into the lungs. 1 Inhaler 3  . topiramate (TOPAMAX) 100 MG tablet Take 2 tablets (200 mg total) at bedtime by mouth. 60 tablet 1   No facility-administered medications prior to visit.      ROS Review of Systems  Constitutional: Negative for chills, fever and malaise/fatigue.  Eyes: Negative for blurred vision.  Respiratory: Negative for shortness of breath.   Cardiovascular: Negative for chest pain and palpitations.  Gastrointestinal: Negative for abdominal pain and nausea.  Genitourinary: Negative for dysuria and hematuria.  Musculoskeletal: Negative for joint pain and myalgias.  Skin: Negative for rash.  Neurological: Negative for tingling and headaches.  Psychiatric/Behavioral: Negative for depression. The patient is not nervous/anxious.     Objective:  Wt 213 lb 3.2 oz (96.7 kg)   LMP 10/29/2017 (Approximate)   BMI 33.39 kg/m   BP/Weight 11/19/2017 11/02/2017 10/06/2017  Systolic BP - 126 105  Diastolic BP - 81 60  Wt. (Lbs) 213.2 211 -  BMI  33.39 33.05 -      Physical Exam  Constitutional: She is oriented to person, place, and time.  Well developed, overweight, NAD, polite, mildly ill appearing, occasional cough  HENT:  Head: Normocephalic and atraumatic.  TMs normal. Oropharynx erythematous, no exudates, tonsils absent.  Eyes: No scleral icterus.  Neck: Normal range of motion. Neck supple. No thyromegaly present.  Cardiovascular: Normal rate, regular rhythm and normal heart sounds. Exam reveals no gallop and no friction rub.  No murmur heard. Pulmonary/Chest: Effort normal. No respiratory distress. She has wheezes. She has no rales.  Coughing fit during interview  Abdominal: Soft. Bowel sounds are normal. There is no tenderness.  Musculoskeletal: She exhibits no edema.  Lymphadenopathy:    She has cervical adenopathy (tender anterior cervical chain).  Neurological: She is alert and oriented to person, place, and time. No cranial nerve deficit. Coordination normal.  Skin: Skin is warm and dry. No rash noted. No erythema. No pallor.  Psychiatric: She has a normal mood and affect. Her behavior is normal. Thought content normal.  Vitals reviewed.    Assessment & Plan:   1. Acute bronchitis, unspecified organism - Administered ipratropium-albuterol (DUONEB) 0.5-2.5 (3) MG/3ML nebulizer solution 3 mL - Administered methylPREDNISolone sodium succinate (SOLU-MEDROL) 125 mg/2 mL injection 125 mg - doxycycline (VIBRA-TABS) 100 MG tablet; Take 1 tablet (100 mg total) by mouth 2 (two) times daily.  Dispense: 20 tablet; Refill: 0 - Phenylephrine-DM-GG-APAP (MUCINEX FAST-MAX COLD FLU) 5-10-200-325 MG/10ML LIQD; Take 20 mLs by mouth every 4 (four) hours for 5 days.  Dispense: 1 Bottle; Refill: 0  2. Mild asthma with acute exacerbation, unspecified whether persistent - ipratropium-albuterol (DUONEB) 0.5-2.5 (3) MG/3ML nebulizer solution 3 mL - methylPREDNISolone sodium succinate (SOLU-MEDROL) 125 mg/2 mL injection 125 mg  3.  Cough - doxycycline (VIBRA-TABS) 100 MG tablet; Take 1 tablet (100 mg total) by mouth 2 (two) times daily.  Dispense: 20 tablet; Refill: 0 - Phenylephrine-DM-GG-APAP (MUCINEX FAST-MAX COLD FLU) 5-10-200-325 MG/10ML LIQD; Take 20 mLs by mouth every 4 (four) hours for 5 days.  Dispense: 1 Bottle; Refill: 0  4. Sore throat - doxycycline (VIBRA-TABS) 100 MG tablet; Take 1 tablet (100 mg total) by mouth 2 (two) times daily.  Dispense: 20 tablet; Refill: 0 - Phenylephrine-DM-GG-APAP (MUCINEX FAST-MAX COLD FLU) 5-10-200-325 MG/10ML LIQD; Take 20 mLs by mouth every 4 (four) hours for 5 days.  Dispense: 1 Bottle; Refill: 0   Meds ordered this encounter  Medications  . ipratropium-albuterol (DUONEB) 0.5-2.5 (3) MG/3ML nebulizer solution 3 mL  . methylPREDNISolone sodium succinate (SOLU-MEDROL) 125 mg/2 mL injection 125 mg  . doxycycline (VIBRA-TABS) 100 MG tablet    Sig: Take 1 tablet (100 mg total) by mouth 2 (two) times daily.    Dispense:  20 tablet    Refill:  0    Order Specific  Question:   Supervising Provider    Answer:   Quentin AngstJEGEDE, OLUGBEMIGA E [[id number]]  . Phenylephrine-DM-GG-APAP (MUCINEX FAST-MAX COLD FLU) 5-10-200-325 MG/10ML LIQD    Sig: Take 20 mLs by mouth every 4 (four) hours for 5 days.    Dispense:  1 Bottle    Refill:  0    Order Specific Question:   Supervising Provider    Answer:   Quentin AngstJEGEDE, OLUGBEMIGA E [id number]    Follow-up: Return if symptoms worsen or fail to improve.   Loletta Specteroger David Kael Keetch PA

## 2017-11-19 NOTE — Patient Instructions (Signed)

## 2017-11-21 MED FILL — ?RISPERIDONE 2 MG TABLET: 2 | 30 days supply | Qty: 30 | Fill #0

## 2017-11-21 MED FILL — TOPIRAMATE 100 MG TABLET: 100 | 30 days supply | Qty: 60 | Fill #0

## 2017-11-21 MED FILL — ?MONTELUKAST SOD 10MG TAB: 10 | 30 days supply | Qty: 30 | Fill #0

## 2017-11-27 ENCOUNTER — Ambulatory Visit (INDEPENDENT_AMBULATORY_CARE_PROVIDER_SITE_OTHER): Payer: Self-pay | Admitting: Physician Assistant

## 2017-11-30 ENCOUNTER — Ambulatory Visit (INDEPENDENT_AMBULATORY_CARE_PROVIDER_SITE_OTHER): Payer: Self-pay | Admitting: Physician Assistant

## 2018-01-23 MED FILL — TOPIRAMATE 100 MG TABLET: 100 | 30 days supply | Qty: 60 | Fill #1

## 2018-03-05 ENCOUNTER — Other Ambulatory Visit (INDEPENDENT_AMBULATORY_CARE_PROVIDER_SITE_OTHER): Payer: Self-pay | Admitting: Physician Assistant

## 2018-03-05 NOTE — Telephone Encounter (Signed)
FWD to PCP. Aster Screws S Baruc Tugwell, CMA  

## 2018-03-06 MED FILL — TOPIRAMATE 100 MG TABLET: 100 | 30 days supply | Qty: 60 | Fill #0

## 2018-06-20 IMAGING — CR DG CHEST 2V
2 series · 2 of 2 positions shown · non-contrast
Comparison: 09/30/17

CLINICAL DATA: COUGH- SOB- Wheeze

EXAM:
CHEST  2 VIEW

[w chest pa]
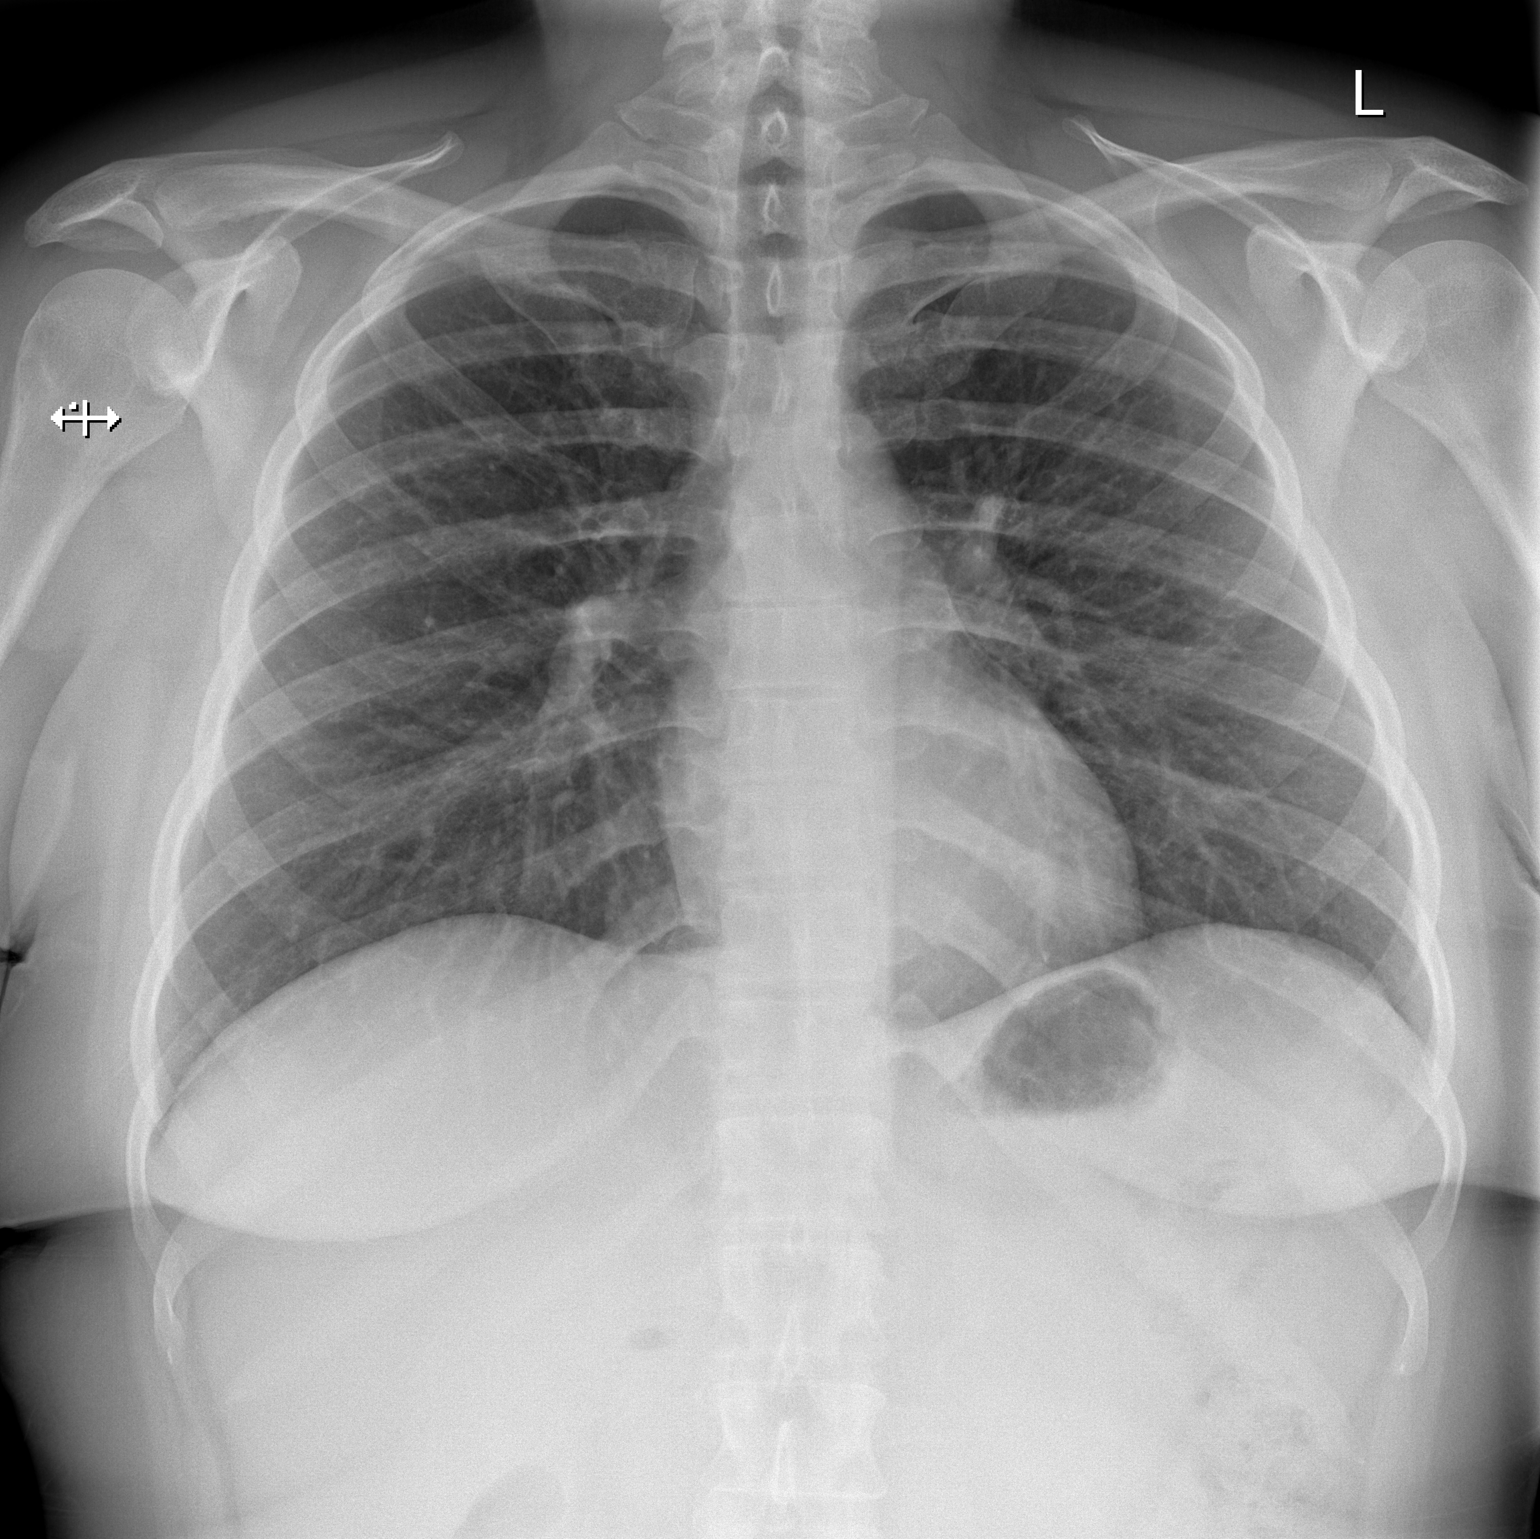

[w chest lat]
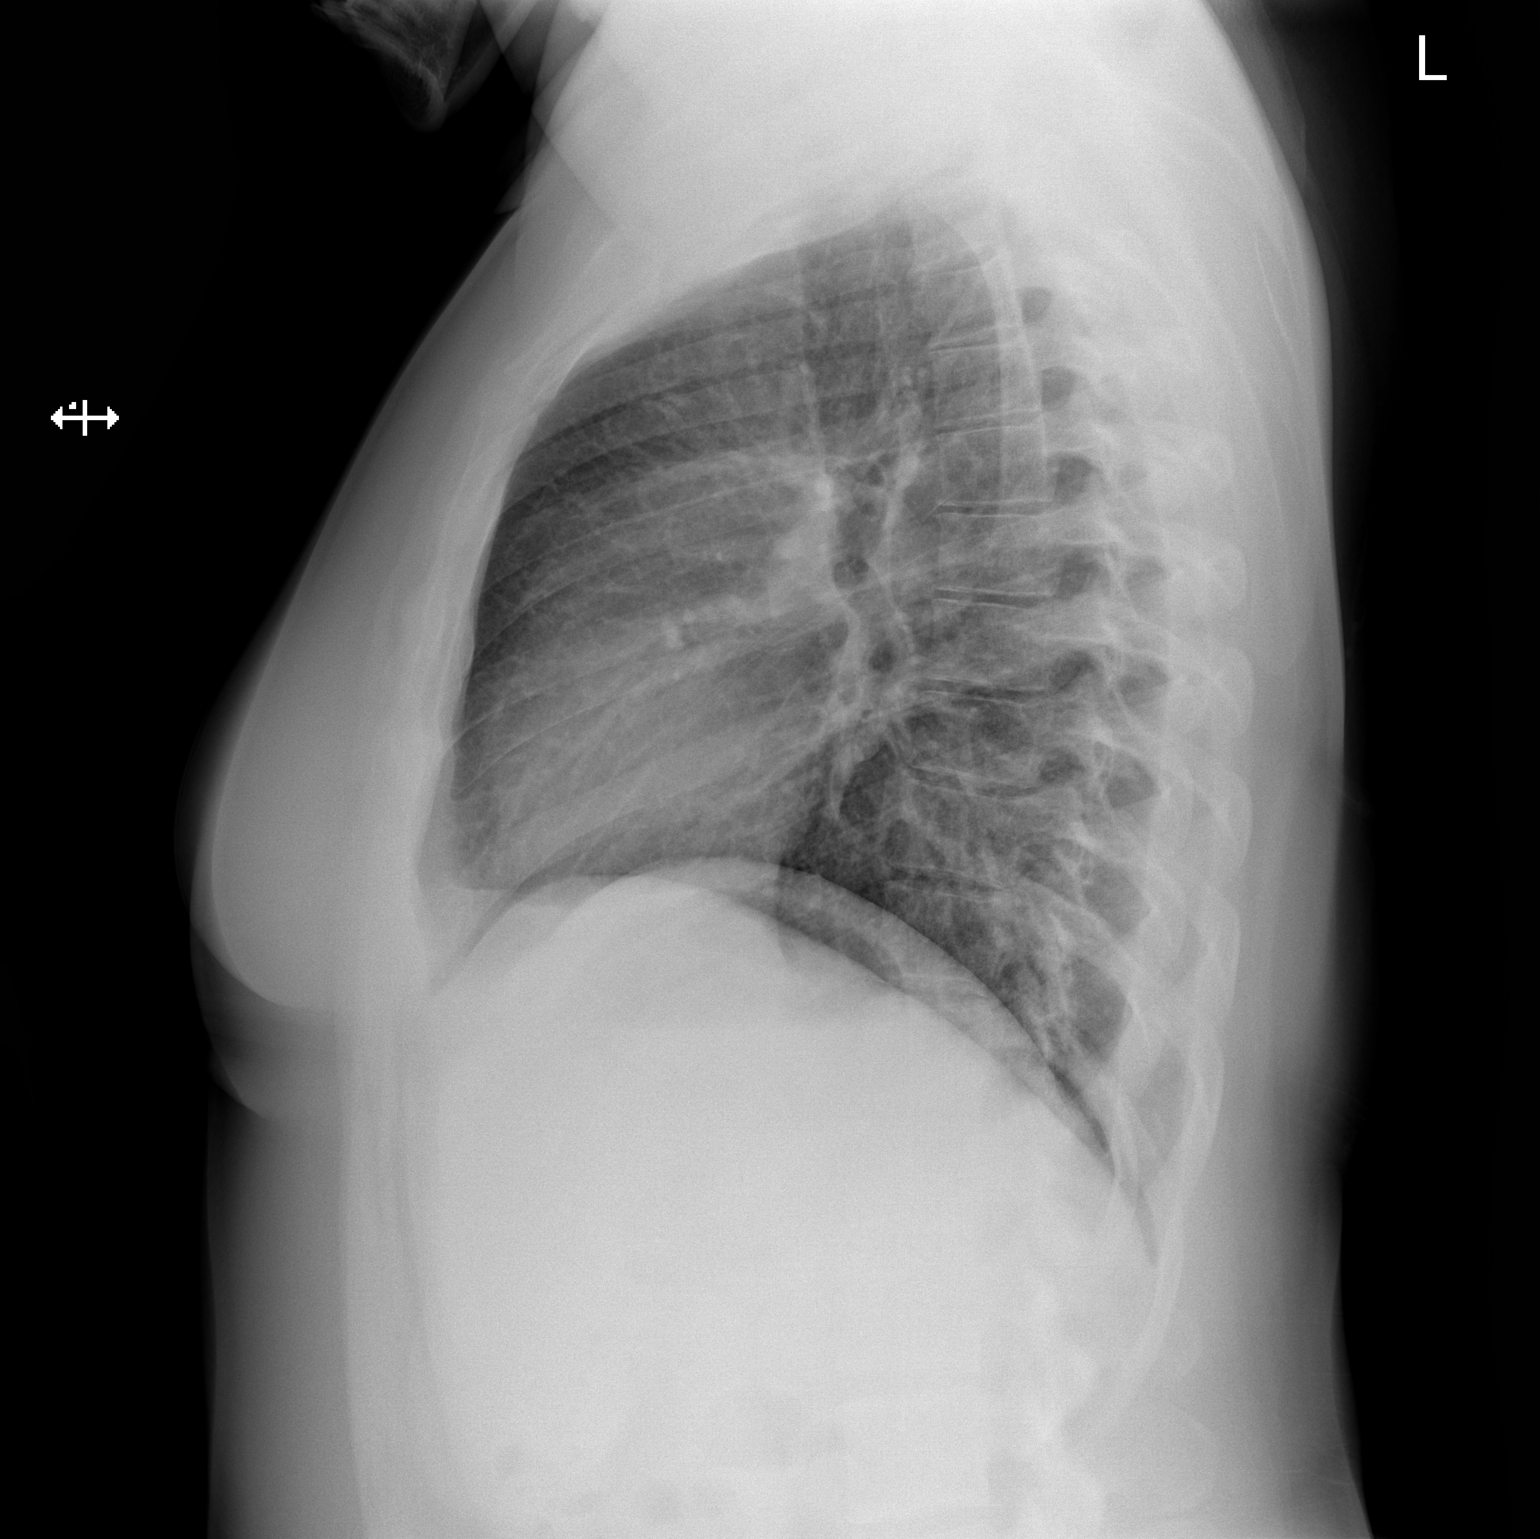

[2 of 2 positions shown; findings below may reference images not displayed]

FINDINGS: The lungs are clear. The pulmonary vasculature is normal. Heart size
is normal. Hilar and mediastinal contours are unremarkable. There is
no pleural effusion.
IMPRESSION: No active cardiopulmonary disease.
